# Patient Record
Sex: Male | Born: 1945
Health system: Southern US, Community
[De-identification: ages and names within clinical notes are randomized; demographics above are authoritative.]

## PROBLEM LIST (undated history)

## (undated) DIAGNOSIS — I639 Cerebral infarction, unspecified: Secondary | ICD-10-CM

## (undated) DIAGNOSIS — Q211 Atrial septal defect: Secondary | ICD-10-CM

## (undated) DIAGNOSIS — E786 Lipoprotein deficiency: Secondary | ICD-10-CM

## (undated) DIAGNOSIS — I2510 Atherosclerotic heart disease of native coronary artery without angina pectoris: Secondary | ICD-10-CM

## (undated) DIAGNOSIS — N400 Enlarged prostate without lower urinary tract symptoms: Secondary | ICD-10-CM

## (undated) DIAGNOSIS — Q2112 Patent foramen ovale: Secondary | ICD-10-CM

## (undated) DIAGNOSIS — I10 Essential (primary) hypertension: Secondary | ICD-10-CM

## (undated) HISTORY — DX: Cerebral infarction, unspecified: I63.9

## (undated) HISTORY — DX: Essential (primary) hypertension: I10

## (undated) HISTORY — DX: Lipoprotein deficiency: E78.6

## (undated) HISTORY — DX: Benign prostatic hyperplasia without lower urinary tract symptoms: N40.0

## (undated) HISTORY — PX: GALLBLADDER SURGERY: SHX652

## (undated) HISTORY — DX: Patent foramen ovale: Q21.12

## (undated) HISTORY — DX: Atherosclerotic heart disease of native coronary artery without angina pectoris: I25.10

## (undated) HISTORY — DX: Atrial septal defect: Q21.1

---

## 2001-10-15 ENCOUNTER — Ambulatory Visit (HOSPITAL_COMMUNITY): Admission: RE | Admit: 2001-10-15 | Discharge: 2001-10-15 | Payer: Self-pay | Admitting: General Surgery

## 2001-10-15 ENCOUNTER — Encounter: Payer: Self-pay | Admitting: General Surgery

## 2001-10-17 ENCOUNTER — Observation Stay (HOSPITAL_COMMUNITY): Admission: RE | Admit: 2001-10-17 | Discharge: 2001-10-18 | Payer: Self-pay | Admitting: General Surgery

## 2001-12-17 HISTORY — PX: COLONOSCOPY: SHX174

## 2002-12-14 ENCOUNTER — Ambulatory Visit (HOSPITAL_COMMUNITY): Admission: RE | Admit: 2002-12-14 | Discharge: 2002-12-14 | Payer: Self-pay | Admitting: Internal Medicine

## 2004-08-01 ENCOUNTER — Ambulatory Visit (HOSPITAL_COMMUNITY): Admission: RE | Admit: 2004-08-01 | Discharge: 2004-08-01 | Payer: Self-pay | Admitting: *Deleted

## 2004-08-03 ENCOUNTER — Inpatient Hospital Stay (HOSPITAL_BASED_OUTPATIENT_CLINIC_OR_DEPARTMENT_OTHER): Admission: RE | Admit: 2004-08-03 | Discharge: 2004-08-03 | Payer: Self-pay | Admitting: Cardiology

## 2004-12-17 HISTORY — PX: INGUINAL HERNIA REPAIR: SUR1180

## 2005-05-16 ENCOUNTER — Ambulatory Visit (HOSPITAL_COMMUNITY): Admission: RE | Admit: 2005-05-16 | Discharge: 2005-05-16 | Payer: Self-pay | Admitting: General Surgery

## 2005-05-16 ENCOUNTER — Emergency Department (HOSPITAL_COMMUNITY): Admission: EM | Admit: 2005-05-16 | Discharge: 2005-05-16 | Payer: Self-pay | Admitting: Emergency Medicine

## 2005-05-19 ENCOUNTER — Emergency Department (HOSPITAL_COMMUNITY): Admission: EM | Admit: 2005-05-19 | Discharge: 2005-05-19 | Payer: Self-pay | Admitting: Emergency Medicine

## 2005-11-21 ENCOUNTER — Ambulatory Visit: Payer: Self-pay | Admitting: Cardiovascular Disease

## 2005-12-17 DIAGNOSIS — I639 Cerebral infarction, unspecified: Secondary | ICD-10-CM

## 2005-12-17 HISTORY — DX: Cerebral infarction, unspecified: I63.9

## 2006-11-26 ENCOUNTER — Inpatient Hospital Stay (HOSPITAL_COMMUNITY): Admission: EM | Admit: 2006-11-26 | Discharge: 2006-11-29 | Payer: Self-pay | Admitting: Emergency Medicine

## 2006-11-26 ENCOUNTER — Ambulatory Visit: Payer: Self-pay | Admitting: Internal Medicine

## 2006-11-27 ENCOUNTER — Encounter: Payer: Self-pay | Admitting: Vascular Surgery

## 2006-11-28 ENCOUNTER — Encounter: Payer: Self-pay | Admitting: Cardiology

## 2006-12-02 ENCOUNTER — Ambulatory Visit: Payer: Self-pay | Admitting: Cardiology

## 2006-12-12 ENCOUNTER — Ambulatory Visit: Payer: Self-pay

## 2007-01-13 ENCOUNTER — Ambulatory Visit: Payer: Self-pay | Admitting: Cardiology

## 2007-01-13 LAB — CONVERTED CEMR LAB
ALT: 22 units/L (ref 0–40)
AST: 15 units/L (ref 0–37)
Albumin: 3.7 g/dL (ref 3.5–5.2)
HDL: 33.6 mg/dL — ABNORMAL LOW (ref >39.0)
Total Protein: 6.3 g/dL (ref 6.0–8.3)
Triglycerides: 69 mg/dL (ref 0–149)
VLDL: 14 mg/dL (ref 0–40)

## 2007-01-22 ENCOUNTER — Ambulatory Visit: Payer: Self-pay | Admitting: Cardiology

## 2007-04-30 ENCOUNTER — Ambulatory Visit: Payer: Self-pay | Admitting: Cardiology

## 2007-04-30 LAB — CONVERTED CEMR LAB
ALT: 27 units/L (ref 0–40)
Albumin: 4.2 g/dL (ref 3.5–5.2)
Alkaline Phosphatase: 57 units/L (ref 39–117)
Cholesterol: 146 mg/dL (ref 0–200)
HDL: 41.6 mg/dL (ref >39.0)
Total CHOL/HDL Ratio: 3.5
Triglycerides: 86 mg/dL (ref 0–149)

## 2007-05-21 ENCOUNTER — Ambulatory Visit (HOSPITAL_COMMUNITY): Admission: RE | Admit: 2007-05-21 | Discharge: 2007-05-21 | Payer: Self-pay | Admitting: Internal Medicine

## 2008-05-03 ENCOUNTER — Ambulatory Visit: Payer: Self-pay | Admitting: Cardiology

## 2008-05-03 LAB — CONVERTED CEMR LAB
Albumin: 3.7 g/dL (ref 3.5–5.2)
Alkaline Phosphatase: 49 units/L (ref 39–117)
BUN: 21 mg/dL (ref 6–23)
Chloride: 109 meq/L (ref 96–112)
Cholesterol: 124 mg/dL (ref 0–200)
Glucose, Bld: 111 mg/dL — ABNORMAL HIGH (ref 70–99)
Total Bilirubin: 1 mg/dL (ref 0.3–1.2)
Triglycerides: 126 mg/dL (ref 0–149)
VLDL: 25 mg/dL (ref 0–40)

## 2008-12-21 ENCOUNTER — Encounter: Payer: Self-pay | Admitting: Orthopedic Surgery

## 2008-12-30 ENCOUNTER — Ambulatory Visit: Payer: Self-pay | Admitting: Orthopedic Surgery

## 2008-12-30 DIAGNOSIS — M25819 Other specified joint disorders, unspecified shoulder: Secondary | ICD-10-CM | POA: Insufficient documentation

## 2008-12-30 DIAGNOSIS — M25519 Pain in unspecified shoulder: Secondary | ICD-10-CM | POA: Insufficient documentation

## 2008-12-30 DIAGNOSIS — M7580 Other shoulder lesions, unspecified shoulder: Secondary | ICD-10-CM

## 2009-02-14 ENCOUNTER — Ambulatory Visit: Payer: Self-pay | Admitting: Orthopedic Surgery

## 2009-02-15 ENCOUNTER — Telehealth: Payer: Self-pay | Admitting: Orthopedic Surgery

## 2009-02-15 ENCOUNTER — Encounter: Payer: Self-pay | Admitting: Orthopedic Surgery

## 2009-02-16 ENCOUNTER — Encounter: Payer: Self-pay | Admitting: Orthopedic Surgery

## 2009-02-18 ENCOUNTER — Ambulatory Visit (HOSPITAL_COMMUNITY): Admission: RE | Admit: 2009-02-18 | Discharge: 2009-02-18 | Payer: Self-pay | Admitting: Orthopedic Surgery

## 2009-02-24 ENCOUNTER — Ambulatory Visit: Payer: Self-pay | Admitting: Orthopedic Surgery

## 2009-04-21 ENCOUNTER — Ambulatory Visit: Payer: Self-pay | Admitting: Orthopedic Surgery

## 2009-06-16 ENCOUNTER — Encounter: Payer: Self-pay | Admitting: Cardiology

## 2009-06-16 ENCOUNTER — Ambulatory Visit: Payer: Self-pay | Admitting: Cardiology

## 2009-06-23 ENCOUNTER — Encounter: Payer: Self-pay | Admitting: Cardiology

## 2009-06-23 ENCOUNTER — Ambulatory Visit: Payer: Self-pay | Admitting: Cardiology

## 2009-06-23 ENCOUNTER — Ambulatory Visit (HOSPITAL_COMMUNITY): Admission: RE | Admit: 2009-06-23 | Discharge: 2009-06-23 | Payer: Self-pay | Admitting: Cardiology

## 2009-06-24 ENCOUNTER — Encounter: Payer: Self-pay | Admitting: Cardiology

## 2009-06-24 LAB — CONVERTED CEMR LAB
AST: 17 units/L (ref 0–37)
Albumin: 4.4 g/dL (ref 3.5–5.2)
Alkaline Phosphatase: 61 units/L (ref 39–117)
BUN: 26 mg/dL — ABNORMAL HIGH (ref 6–23)
CO2: 23 meq/L (ref 19–32)
Calcium: 9.5 mg/dL (ref 8.4–10.5)
Cholesterol: 162 mg/dL (ref 0–200)
Creatinine, Ser: 1.27 mg/dL (ref 0.40–1.50)
HDL: 47 mg/dL (ref >39)
LDL Cholesterol: 92 mg/dL (ref 0–99)
Total Bilirubin: 0.7 mg/dL (ref 0.3–1.2)
Total Protein: 6.7 g/dL (ref 6.0–8.3)
Triglycerides: 113 mg/dL (ref <150)

## 2009-12-13 ENCOUNTER — Ambulatory Visit: Payer: Self-pay | Admitting: Orthopedic Surgery

## 2009-12-13 DIAGNOSIS — M23302 Other meniscus derangements, unspecified lateral meniscus, unspecified knee: Secondary | ICD-10-CM | POA: Insufficient documentation

## 2009-12-13 DIAGNOSIS — M1710 Unilateral primary osteoarthritis, unspecified knee: Secondary | ICD-10-CM | POA: Insufficient documentation

## 2009-12-13 DIAGNOSIS — IMO0002 Reserved for concepts with insufficient information to code with codable children: Secondary | ICD-10-CM | POA: Insufficient documentation

## 2010-01-16 ENCOUNTER — Telehealth (INDEPENDENT_AMBULATORY_CARE_PROVIDER_SITE_OTHER): Payer: Self-pay

## 2010-07-04 ENCOUNTER — Ambulatory Visit: Payer: Self-pay | Admitting: Cardiology

## 2010-07-04 DIAGNOSIS — I10 Essential (primary) hypertension: Secondary | ICD-10-CM | POA: Insufficient documentation

## 2010-07-04 DIAGNOSIS — I2510 Atherosclerotic heart disease of native coronary artery without angina pectoris: Secondary | ICD-10-CM | POA: Insufficient documentation

## 2010-07-05 ENCOUNTER — Telehealth (INDEPENDENT_AMBULATORY_CARE_PROVIDER_SITE_OTHER): Payer: Self-pay

## 2010-08-31 ENCOUNTER — Ambulatory Visit: Payer: Self-pay | Admitting: Internal Medicine

## 2010-08-31 DIAGNOSIS — R1013 Epigastric pain: Secondary | ICD-10-CM | POA: Insufficient documentation

## 2010-08-31 DIAGNOSIS — K921 Melena: Secondary | ICD-10-CM | POA: Insufficient documentation

## 2010-09-01 ENCOUNTER — Encounter: Payer: Self-pay | Admitting: Internal Medicine

## 2010-09-16 HISTORY — PX: ESOPHAGOGASTRODUODENOSCOPY: SHX1529

## 2010-09-16 HISTORY — PX: COLONOSCOPY: SHX174

## 2010-09-18 ENCOUNTER — Ambulatory Visit: Payer: Self-pay | Admitting: Internal Medicine

## 2010-09-18 ENCOUNTER — Ambulatory Visit (HOSPITAL_COMMUNITY): Admission: RE | Admit: 2010-09-18 | Discharge: 2010-09-18 | Payer: Self-pay | Admitting: Internal Medicine

## 2010-09-22 ENCOUNTER — Encounter: Payer: Self-pay | Admitting: Internal Medicine

## 2011-01-14 LAB — CONVERTED CEMR LAB: Creatinine, Ser: 1.21 mg/dL (ref 0.40–1.50)

## 2011-01-16 NOTE — Letter (Signed)
Summary: REFERRAL FROM DR Margo Aye  REFERRAL FROM DR HALL   Imported By: Rexene Alberts 09/01/2010 09:43:43  _____________________________________________________________________  External Attachment:    Type:   Image     Comment:   External Document

## 2011-01-16 NOTE — Assessment & Plan Note (Signed)
Summary: gi bleeding,probable ulcer,dark stools/ss   Visit Type:  Initial Consult Referring Provider:  Dwana [name] Primary Care Provider:  Dwana [name]  Chief Complaint:  ? gi bleed/ ? ulcer/ dark stools.  History of Present Illness: [name] [name] is a pleasant 65 y/o WM, patient of Dr. Dwana [name], who presents for further evaluation of recent hypotension, epigastric pain, [name]. Symptoms began two weeks ago. Initally noted significant drop in energy. Several days after symptoms again, he saw Dr. Dwana [name], BP low. He had labs including CBC and H.Pylori but we have not received the records. He was started on Nexium.  Holding lisinopril and ASA. Tick bite prior to these symptoms. Now epig pain better, stools brown.     Current Medications (verified): 1)  Doxazosin Mesylate 8 Mg Tabs (Doxazosin Mesylate) .Marland Kitchen.. 1 Tab Once Daily 2)  Fish Oil 1000 Mg Caps (Omega-3 Fatty Acids) .Marland Kitchen.. 1 Tab Once Daily 3)  Finasteride 5 Mg Tabs (Finasteride) .Marland Kitchen.. 1 Tab Once Daily 4)  Lipitor 20 Mg Tabs (Atorvastatin Calcium) .... Take One Tablet By Mouth Daily. 5)  Nexium 40 Mg Cpdr (Esomeprazole Magnesium) .... One By Mouth Daily, Started 9/11  Allergies (verified): No Known Drug Allergies  Past History:  Past Medical History: htn cholesterol nonobstructive CAD BPH CVA, 2007 TCS, 2003, Dr. Rourk-->hemorrhoids, otherwise normal. Surveillance was due in 2008.  Past Surgical History: hernia, left inguinal, 2006 hernia gallbladder  Family History: NA No FH of CRC, liver, PUD. Brother, colostomy bag, GI issues since teenager. ?colon cancer age 68s Mother had colon polyps, advanced age, deceased due to AAA rupture.   Social History: Patient is married. Two children. retired from [employer]. Never smoked. Beer on weekend, 3-4 12 ounce  Review of Systems General:  Complains of fatigue and weakness; denies fever, chills, sweats, anorexia, and weight loss. Eyes:  Denies vision loss. ENT:  Denies nasal  congestion, sore throat, hoarseness, and difficulty swallowing. CV:  Denies chest pains, angina, palpitations, dyspnea on exertion, and peripheral edema. Resp:  Denies dyspnea at rest, dyspnea with exercise, cough, sputum, and wheezing. GI:  See HPI. GU:  Denies urinary burning and blood in urine. MS:  Denies joint pain / LOM. Derm:  Denies rash and itching. Neuro:  Denies weakness, frequent headaches, memory loss, and confusion. Psych:  Denies depression and anxiety. Endo:  Denies unusual weight change. Heme:  Denies bruising and bleeding. Allergy:  Denies hives and rash.  Vital Signs:  Patient profile:   65 year old male Height:      70 inches Weight:      194 pounds BMI:     27.94 Temp:     97.8 degrees F oral Pulse rate:   80 / minute BP sitting:   110 / 62  (left arm) Cuff size:   regular  Vitals Entered By: Cloria Spring LPN (August 31, 2010 1:23 PM)  Physical Exam  General:  Well developed, well nourished, no acute distress. Head:  Normocephalic and atraumatic. Eyes:  Conjunctivae pink, no scleral icterus.  Mouth:  Oropharyngeal mucosa moist, pink.  No lesions, erythema or exudate.    Neck:  Supple; no masses or thyromegaly. Lungs:  Clear throughout to auscultation. Heart:  Regular rate and rhythm; no murmurs, rubs,  or bruits. Abdomen:  Bowel sounds normal.  Abdomen is soft, nontender, nondistended.  No rebound or guarding.  No hepatosplenomegaly, masses or hernias.  No abdominal bruits.  Extremities:  No clubbing, cyanosis, edema or deformities noted. Neurologic:  Alert and  oriented x4;  grossly normal neurologically. Skin:  Intact without significant lesions or rashes. Cervical Nodes:  No significant cervical adenopathy. Psych:  Alert and cooperative. Normal mood and affect.  Impression & Recommendations:  Problem # 1:  EPIGASTRIC PAIN (ICD-789.06)  Recent fatigue, epigastric pain, [name]. Symtpoms better since starting Nexium. Tick exposure, likely  unrelated. ?intermittent GI bleed secondary to PUD. EGD to be performed in near future.  Risks, alternatives, benefits including but not limited to risk of reaction to medications, bleeding, infection, and perforation addressed.  Patient voiced understanding and verbal consent obtained.   We have requested labs from Dr. Keane Police office.  Orders: Consultation Level III ([address])  Problem # 2:  ? of NEOPLASM, MALIGNANT, COLON, FAMILY HX, SIBLING (ICD-V16.0)  Patient was recommended to have f/u TCS in 2008. Arrange for TCS now as patient is agreeable. Colonoscopy to be performed in near future.  Risks, alternatives, and benefits including but not limited to the risk of reaction to medication, bleeding, infection, and perforation were addressed.  Patient voiced understanding and provided verbal consent.   Orders: Consultation Level III ([address]) I would like to thank Dr. Dwana [name] for allowing [place] to take part in the care of this nice patient.

## 2011-01-16 NOTE — Progress Notes (Signed)
Summary: refill  Medications Added LISINOPRIL 20 MG TABS (LISINOPRIL) 1 tab once daily LIPITOR 20 MG TABS (ATORVASTATIN CALCIUM) Take one tablet by mouth daily.       Phone Note Call from Patient   Caller: Spouse Reason for Call: Refill Medication Summary of Call: Lisinopril 20mg  and Lipitor 10mg  (90 day supply)/wants refills for 1 yr sent to [facility]/tg Initial call taken by: Raechel Ache [facility],  January 16, 2010 4:31 PM    New/Updated Medications: LISINOPRIL 20 MG TABS (LISINOPRIL) 1 tab once daily LIPITOR 20 MG TABS (ATORVASTATIN CALCIUM) Take one tablet by mouth daily. Prescriptions: LIPITOR 20 MG TABS (ATORVASTATIN CALCIUM) Take one tablet by mouth daily.  #90 x 1   Entered by:   Larita Fife Via LPN   Authorized by:   Gaylord Shih, MD, [facility]   Signed by:   Larita Fife Via LPN on 16/09/9603   Method used:   Electronically to        [facility]* (mail-order)       [address] [place], [place]  [address]       Ph: [phone]       Fax: [phone]   RxID:   [id number] LISINOPRIL 20 MG TABS (LISINOPRIL) 1 tab once daily  #90 x 1   Entered by:   Larita Fife Via LPN   Authorized by:   Gaylord Shih, MD, [facility]   Signed by:   Larita Fife Via LPN on 10/13/2535   Method used:   Electronically to        [facility]* (mail-order)       [address] [place], [place]  [address]       Ph: [phone]       Fax: [phone]   RxID:   [id number]

## 2011-01-16 NOTE — Assessment & Plan Note (Signed)
Summary: 1 yr f/u per checkout on 06/16/09/tg      Allergies Added: NKDA  Primary Provider:  Catalina Pizza   History of Present Illness: Mr Bonito returns for E and M of his nonobstuctive CAD, HTN, HL, hx of hypertensive CVA, and asx atrial shunt.  He is extremely active and asx. No sxs of angina or TIAs. Lab work followed by Dr Margo Aye. Last Echo 7/10 stable.   Current Medications (verified): 1)  Aspirin 81 Mg Tbec (Aspirin) .... Take One Tablet By Mouth Daily 2)  Lisinopril 20 Mg Tabs (Lisinopril) .Marland Kitchen.. 1 Tab Once Daily 3)  Doxazosin Mesylate 8 Mg Tabs (Doxazosin Mesylate) .Marland Kitchen.. 1 Tab Once Daily 4)  Fish Oil 1000 Mg Caps (Omega-3 Fatty Acids) .Marland Kitchen.. 1 Tab Once Daily 5)  Finasteride 5 Mg Tabs (Finasteride) .Marland Kitchen.. 1 Tab Once Daily 6)  Lipitor 20 Mg Tabs (Atorvastatin Calcium) .... Take One Tablet By Mouth Daily.  Allergies (verified): No Known Drug Allergies  Past History:  Past Medical History: Last updated: 12/30/2008 htn cholesterol  Past Surgical History: Last updated: 12/30/2008 hernia gallbladder  Family History: Last updated: 12/30/2008 NA  Social History: Last updated: 12/30/2008 Patient is married.  retired  Risk Factors: Caffeine Use: 0 (12/30/2008)  Risk Factors: Smoking Status: never (12/30/2008)  Review of Systems       negative other than HPI  Vital Signs:  Patient profile:   65 year old male Height:      70 inches Weight:      191 pounds BMI:     27.50 Pulse rate:   67 / minute Resp:     16 per minute BP sitting:   93 / 53  (left arm)  Vitals Entered By: Marrion Coy, CNA (July 04, 2010 9:58 AM)  Physical Exam  General:  Well developed, well nourished, in no acute distress. Head:  normocephalic and atraumatic Eyes:  PERRLA/EOM intact; conjunctiva and lids normal. Neck:  Neck supple, no JVD. No masses, thyromegaly or abnormal cervical nodes. Chest Shandale Malak:  no deformities or breast masses noted Lungs:  Clear bilaterally to auscultation and  percussion. Heart:  Non-displaced PMI, chest non-tender; regular rate and rhythm, S1, S2 without murmurs, rubs or gallops. Carotid upstroke normal, no bruit. Normal abdominal aortic size, no bruits. Femorals normal pulses, no bruits. Pedals normal pulses. No edema, no varicosities. Abdomen:  Bowel sounds positive; abdomen soft and non-tender without masses, organomegaly, or hernias noted. No hepatosplenomegaly. Msk:  Back normal, normal gait. Muscle strength and tone normal. Pulses:  pulses normal in all 4 extremities Extremities:  No clubbing or cyanosis. Neurologic:  Alert and oriented x 3. Skin:  Intact without lesions or rashes. Psych:  Normal affect.   Problems:  Medical Problems Added: 1)  Dx of Hypertension, Benign  (ICD-401.1) 2)  Dx of Cad, Native Vessel  (ICD-414.01)  EKG  Procedure date:  07/04/2010  Findings:      NSR, Nl EKG  Impression & Recommendations:  Problem # 1:  CAD, NATIVE VESSEL (ICD-414.01) Assessment Unchanged  His updated medication list for this problem includes:    Aspirin 81 Mg Tbec (Aspirin) .Marland Kitchen... Take one tablet by mouth daily    Lisinopril 20 Mg Tabs (Lisinopril) .Marland Kitchen... 1 tab once daily  Problem # 2:  HYPERTENSION, BENIGN (ICD-401.1) Assessment: Improved  His updated medication list for this problem includes:    Aspirin 81 Mg Tbec (Aspirin) .Marland Kitchen... Take one tablet by mouth daily    Lisinopril 20 Mg Tabs (Lisinopril) .Marland Kitchen... 1 tab  once daily    Doxazosin Mesylate 8 Mg Tabs (Doxazosin mesylate) .Marland Kitchen... 1 tab once daily  Patient Instructions: 1)  Your physician recommends that you schedule a follow-up appointment in: 12 months 2)  Your physician recommends that you continue on your current medications as directed. Please refer to the Current Medication list given to you today.

## 2011-01-16 NOTE — Letter (Signed)
Summary: LABS FROM DR Dwana Melena  LABS FROM DR [facility]   Imported By: Rexene Alberts 09/22/2010 12:13:18  _____________________________________________________________________  External Attachment:    Type:   Image     Comment:   External Document

## 2011-01-16 NOTE — Letter (Signed)
Summary: tcs/egd order  tcs/egd order   Imported By: Ave Filter 08/31/2010 14:25:03  _____________________________________________________________________  External Attachment:    Type:   Image     Comment:   External Document

## 2011-01-16 NOTE — Letter (Signed)
Summary: Patient Notice, Endo Biopsy Results  [facility]  [address]   [place], [place] [address]   Phone: [phone]  Fax: [phone]       September 22, 2010   [name] [name] [address] [place], [place]  [address] May 28, 1946    Dear [name],  I am pleased to inform you that the biopsies taken during your recent endoscopic examination did not show any evidence of cancer or infection upon pathologic examination.  Additional information/recommendations:  Continue with the treatment plan as outlined on the day of your exam.  You should have a repeat endoscopic examination in 3 months.   Please call us if you are having persistent problems or have questions about your condition that have not been fully answered at this time.  Sincerely,    R. Roetta Sessions MD, FACP [facility] Ph: [phone]   Fax: [phone]   Appended Document: Patient Notice, Endo Biopsy Results letter mailed to pt  Appended Document: Patient Notice, Endo Biopsy Results reminder in computer

## 2011-01-16 NOTE — Progress Notes (Signed)
Summary: RX REFILLS   Phone Note Call from Patient Call back at [phone]   Caller: PT Reason for Call: Refill Medication Summary of Call: PT NEEDS RX CALLED IN SAME PLACE WE HAVE ALWAYS CALLED IN FOR THEM DOES NOT REMEMBER NAME. LIPITOR AND LISINOPRIL THEY ARE NO REFILLS SO THEY NEED NEW CALLED IN. Initial call taken by: Faythe Ghee,  July 05, 2010 11:48 AM    Prescriptions: LIPITOR 20 MG TABS (ATORVASTATIN CALCIUM) Take one tablet by mouth daily.  #90 x 3   Entered by:   Larita Fife Via LPN   Authorized by:   Gaylord Shih, MD, [facility]   Signed by:   Larita Fife Via LPN on 09/81/1914   Method used:   Electronically to        [facility]* (mail-order)       [address] [place], [place]  [address]       Ph: [phone]       Fax: [phone]   RxID:   [id number] LISINOPRIL 20 MG TABS (LISINOPRIL) 1 tab once daily  #90 x 3   Entered by:   Larita Fife Via LPN   Authorized by:   Gaylord Shih, MD, [facility]   Signed by:   Larita Fife Via LPN on 40/34/7425   Method used:   Electronically to        [facility]* (mail-order)       [address] [place], [place]  [address]       Ph: [phone]       Fax: [phone]   RxID:   [id number]

## 2011-01-25 ENCOUNTER — Encounter: Payer: Self-pay | Admitting: Internal Medicine

## 2011-01-31 ENCOUNTER — Encounter: Payer: Self-pay | Admitting: Internal Medicine

## 2011-02-01 NOTE — Letter (Addendum)
Summary: TRIAGE ORDER  TRIAGE ORDER   Imported By: Ave Filter 01/25/2011 09:56:53  _____________________________________________________________________  External Attachment:    Type:   Image     Comment:   External Document  Appended Document: TRIAGE ORDER PATIENT CALLED AN CANCELLED PROCEDURE/HE IS SICK N/V/D WILL CALL THE OFFICE BACK TO R/S  Appended Document: TRIAGE ORDER PATIENT IS R/S FOR FRIDAY MARCH 9TH AT 8:30

## 2011-02-23 ENCOUNTER — Encounter: Payer: Medicare Other | Admitting: Internal Medicine

## 2011-02-23 ENCOUNTER — Other Ambulatory Visit: Payer: Self-pay | Admitting: Internal Medicine

## 2011-02-23 ENCOUNTER — Ambulatory Visit (HOSPITAL_COMMUNITY)
Admission: RE | Admit: 2011-02-23 | Discharge: 2011-02-23 | Disposition: A | Discharge status: Home / Self Care | Payer: Medicare Other | Source: Ambulatory Visit | Attending: Internal Medicine | Admitting: Internal Medicine

## 2011-02-23 DIAGNOSIS — K257 Chronic gastric ulcer without hemorrhage or perforation: Secondary | ICD-10-CM | POA: Insufficient documentation

## 2011-02-23 DIAGNOSIS — K2100 Gastro-esophageal reflux disease with esophagitis, without bleeding: Secondary | ICD-10-CM

## 2011-02-23 DIAGNOSIS — I10 Essential (primary) hypertension: Secondary | ICD-10-CM | POA: Insufficient documentation

## 2011-02-23 DIAGNOSIS — E785 Hyperlipidemia, unspecified: Secondary | ICD-10-CM | POA: Insufficient documentation

## 2011-02-23 DIAGNOSIS — Z09 Encounter for follow-up examination after completed treatment for conditions other than malignant neoplasm: Secondary | ICD-10-CM

## 2011-02-23 DIAGNOSIS — K2940 Chronic atrophic gastritis without bleeding: Secondary | ICD-10-CM

## 2011-02-23 DIAGNOSIS — K449 Diaphragmatic hernia without obstruction or gangrene: Secondary | ICD-10-CM

## 2011-02-23 DIAGNOSIS — Z79899 Other long term (current) drug therapy: Secondary | ICD-10-CM | POA: Insufficient documentation

## 2011-02-26 HISTORY — PX: ESOPHAGOGASTRODUODENOSCOPY: SHX1529

## 2011-02-26 NOTE — Op Note (Signed)
  NAME:  [name] [name], [name] [name] NO.:  [id number]  MEDICAL RECORD NO.:  [id number]           PATIENT TYPE:  O  LOCATION:  DAYP                          FACILITY:  [facility]  PHYSICIAN:  R. Roetta Sessions, M.D. DATE OF BIRTH:  08/02/1946  DATE OF PROCEDURE: DATE OF DISCHARGE:                              OPERATIVE REPORT   PROCEDURE:  EGD with biopsy.  INDICATIONS FOR PROCEDURE:  A 65 year old gentleman with history of gastric ulcer diagnosed EGD.  Last fall, biopsies negative for malignancy and H. pylori.  He used to take quite a bit of aspirin powders, he is refrained from that behavior at my request.  Clinically, he is doing well, he is no longer acid suppression therapy.  EGD is now being done for followup.  Risks, benefits, limitations, alternatives, imponderables have been discussed, questions answered.  Please see the documentation in the medical record.  PROCEDURE NOTE:  O2 saturation, blood pressure, pulse, respirations were monitored throughout the entire procedure.  CONSCIOUS SEDATION:  Versed 4 mg IV, Demerol 75 mg IV in divided doses.  INSTRUMENT:  Pentax video chip system.  Cetacaine spray for topical pharyngeal anesthesia.  INSTRUMENT:  Pentax video chip system.  FINDINGS:  Examination of tubular esophagus revealed circumferential distal esophageal erosions within 5 mm of the EG junction, there is no Barrett's esophagus or other abnormality.  EG junction easily traversed.  Stomach:  Gastric cavity was emptied and insufflated well with air. Thorough examination of gastric mucosa including retroflexion of the proximal stomach, esophagogastric junction demonstrated hiatal hernia and multiple antral erosions.  There was no infiltrating process and there was no ulcer.  Pylorus was patent, easily traversed.  Examination of the bulb and second portion revealed no abnormalities.  THERAPEUTIC/DIAGNOSTIC MANEUVERS PERFORMED:  I elected to go ahead and biopsy the  inflamed antral mucosa one more time to double check for H. pylori.  He denies to take any nonsteroidal agents.  The patient tolerated the procedure well.  IMPRESSION: 1. Distal esophageal erosions consistent with mild reflux esophagitis. 2. Small hiatal hernia, antral erosions of uncertain significance     status post biopsy, otherwise normal stomach, patent pylorus,     normal D1 and D2.  RECOMMENDATIONS: 1. Would be prudent for, Mr. Ojeda, to remain on acid suppression     therapy and feel the benefits would outweigh the risk, i.e. Nexium     40 mg orally daily. 2. Antireflux literature provided. 3. Follow up on path. 4. Further recommendations to follow.     Jonathon Bellows, M.D.     RMR/MEDQ  D:  02/23/2011  T:  02/23/2011  Job:  [id number]  cc:   Catalina Pizza, M.D. Fax: [phone]  Electronically Signed by Lorrin Goodell M.D. on 02/26/2011 10:30:07 AM

## 2011-02-28 LAB — HEMOGLOBIN AND HEMATOCRIT, BLOOD: Hemoglobin: 15.1 g/dL (ref 13.0–17.0)

## 2011-03-04 ENCOUNTER — Encounter: Payer: Self-pay | Admitting: Internal Medicine

## 2011-03-15 NOTE — Letter (Signed)
Summary: Patient Notice, Endo Biopsy Results  [facility]  [address]   [place], [place] [address]   Phone: [phone]  Fax: [phone]       March 04, 2011   [name] [name] [address] [place], [place]  [address] [birth year]    Dear Mr. Yacoub,  I am pleased to inform you that the biopsies taken during your recent endoscopic examination did not show any evidence of cancer or infection upon pathologic examination.  There was only mild inflammation.  Additional information/recommendations:  No further action is needed at this time.  Please follow-up with your primary care physician for your other healthcare needs.  Continue with the treatment plan as outlined on the day of your exam.  Please call us if you are having persistent problems or have questions about your condition that have not been fully answered at this time.  Sincerely,    R. Roetta Sessions MD, Caleen Essex  [facility] Ph: [phone]   Fax: [phone]   Appended Document: Patient Notice, Endo Biopsy Results letter mailed to pt  Appended Document: Patient Notice, Endo Biopsy Results CC TO PCP  Appended Document: Patient Notice, Endo Biopsy Results reminder in epic

## 2011-05-01 NOTE — Assessment & Plan Note (Signed)
[facility]                            CARDIOLOGY OFFICE NOTE   NAME:Mayberry, KAMEL HAVEN                     MRN:          [id number]  DATE:04/30/2007                            DOB:          [birth year]    Mr. Cabello returns today for further management of the following issues.  1. Nonobstructive coronary artery disease.  2. Hypertension.  3. Status post left thalamic cerebrovascular event, probably      hypertensive.  4. Patent foramen ovale.  5. Atrial septal aneurysm.  6. Hyperlipidemia.   He has no complaints today.  He remains very active.   MEDICATIONS:  1. Aspirin 325 a day.  2. Lisinopril 20 mg a day.  3. Doxazosin 8 mg a day.  4. Lipitor 10 mg a day.   PHYSICAL EXAM:  Blood pressure is 128/66, pulse 67 and regular, weight  193.  HEENT:  Normocephalic, atraumatic.  PERRLA.  Extraocular muscles are  intact.  Sclerae clear.  Facial symmetry is normal.  NECK:  Supple.  Carotids are full without bruits.  There is no JVD.  Thyroid is not enlarged.  Trachea is midline.  LUNGS:  Clear.  HEART:  Soft S1, S2.  ABDOMEN:  Soft with good bowel sounds.  No midline bruit.  EXTREMITIES:  Reveal no edema.  Pulses intact.  NEURO:  Intact.   We need followup lipids and LFTs on Lipitor 10 mg a day.  Will obtain  these today.  I have made no changes in his program.  I will plan on  seeing him back in a year.     [name], MD, [facility]  Electronically Signed    TCW/MedQ  DD: 04/30/2007  DT: 04/30/2007  Job #: [id number]

## 2011-05-01 NOTE — Assessment & Plan Note (Signed)
[facility]                            CARDIOLOGY OFFICE NOTE   NAME:Dain, WINN MUEHL                     MRN:          [id number]  DATE:05/03/2008                            DOB:          04/05/1946    Mr. Sanford returns  for further management of the following issues:  1. Nonobstructive coronary disease.  2. Hypertension.  3. Status post hypertensive left thalamic cerebrovascular infarct.  4. Patent foramen ovale.  5. Atrial septal aneurysm with positive bubble study by TEE.  6. Hyperlipidemia.   He is doing remarkably well.  He remains extremely active.  He is due  blood work.   His medications include:  1. Aspirin 325 mg daily.  2. Lisinopril 20 mg daily.  3. Doxazosin 8 mg daily.  4 .  Lipitor 10 mg daily.   EXAMINATION:  Blood pressure 119/62.  His pulse is 66 and regular.  He  is in sinus rhythm with a normal EKG.  Low voltage.  Weight is 194,  stable.  HEENT:  Normocephalic, atraumatic.  PERRLA.  Extraocular movements  intact.  Sclerae clear.  Facial symmetry is normal.  Carotid upstrokes are equal bilaterally without bruits.  No JVD.  NECK:  Supple.  Thyroid is not enlarged.  LUNGS:  Clear.  Heart reveals a regular rate and rhythm.  No murmur, rub  or gallop.  ABDOMEN:  Soft.  Good bowel sounds. No midline bruit.  EXTREMITIES:  No clubbing, cyanosis or edema.  Pulses are intact.  NEURO:  Exam is intact.   Mr. Diemer is doing remarkably well.  We will continue with his current  medical program.  We will obtain a comprehensive metabolic panel and a  lipid panel.  We will call him with the results.  We will plan on seeing  him back in a year.  At that time, we will follow up with a 2D  echocardiogram.     Maisie Fus C. Daleen Squibb, MD, [facility]  Electronically Signed    TCW/MedQ  DD: 05/03/2008  DT: 05/03/2008  Job #: [id number]   cc:   Catalina Pizza, M.D.

## 2011-05-01 NOTE — Assessment & Plan Note (Signed)
[facility]                       [place] CARDIOLOGY OFFICE NOTE   NAME:[name] [name]                     MRN:          [id number]  DATE:06/16/2009                            DOB:          1945/12/26    [name] [name] returns today for followup of the following issues.  1. Nonobstructive coronary artery disease.  2. Hypertension.  3. Status post hypertensive, left thalamic cerebrovascular accident,      December 2007.  4. Patent foramen ovale.  5. Atrial septal aneurysm with positive bubble study by TE.  6. Mixed hyperlipidemia.   He is doing remarkably well.  He remains very [place] his [place].  He  has had no symptoms of angina or ischemia.  He denies any orthopnea, PND  peripheral edema, tachy palpitations, or syncope.  He has had no  symptoms of a mini stroke.   His meds include:  1. Aspirin 81 mg per day.  2. Fish oil 1000 mg per day.  3. Finasteride 5 mg per day.  4. Lipitor 10 mg per day.  5. Doxazosin 8 mg per day.  6. Lisinopril 20 mg per day.   He does have a dry cough, particularly at night.  I told him this was  lisinopril, but he would rather stay with this than go to a non generic  ARB.   PHYSICAL EXAMINATION:  VITAL SIGNS:  Today, his blood pressure is 103/53  in the right arm, 102/56 in the left arm, pulse 56 and regular.  His  weight is down 8 pounds to 186.  He is 5 feet 10 inches.  HEENT:  Looks much younger than stated age.  Alert and oriented x3.  NECK:  Supple.  Carotid upstrokes were equal bilaterally without bruits.  No thyromegaly.  Trachea is midline.  No lymphadenopathy.  CHEST:  Lungs  are clear to auscultation and percussion.  HEART:  Nondisplaced PMI.  Normal S1 and S2.  No gallop.  ABDOMEN:  Soft.  Good bowel sounds.  No midline bruit.  No hepatomegaly.  EXTREMITIES:  No cyanosis, clubbing, or edema.  Pulses are intact.  NEURO:  Intact.  SKIN:  Unremarkable.   ASSESSMENT AND PLAN:  [name] [name] is doing  remarkably well.  He has an  ACE induced cough, but he would rather stay with this than go with a non  generic ARB.  He is due fasting lipids and a CMET which we will arrange.  He has not had a 2-D echocardiogram to follow up on his patent foramen  ovale since 2007.  We will arrange such with a bubble study as an  outpatient.   Assuming, he is stable, I will see him back in a year.     [name], MD, [facility]  Electronically Signed    TCW/MedQ  DD: 06/16/2009  DT: 06/17/2009  Job #: [id number]   cc:   Catalina Pizza, M.D.

## 2011-05-04 NOTE — Op Note (Signed)
NAME:  CALVEN, GILKES NO.:  [id number]   MEDICAL RECORD NO.:  [id number]                   PATIENT TYPE:  AMB   LOCATION:  DAY                                  FACILITY:  [facility]   PHYSICIAN:  R. Roetta Sessions, M.D.              DATE OF BIRTH:  02/25/1946   DATE OF PROCEDURE:  DATE OF DISCHARGE:                                 OPERATIVE REPORT   PROCEDURE:  Screening colonoscopy.   ENDOSCOPIST:  Gerrit Friends. Rourk, M.D.   INDICATIONS FOR PROCEDURE:  The patient is a 65 year old gentleman with a  positive family history of colon cancer in a first degree relative and  precancerous polyps in another first degree relative.  He is devoid of any  lower GI tract symptoms.  He has never had his lower GI tract evaluated.  Colonoscopy is now being done.  This approach has been discussed with the  patient the potential risks, benefits, and alternatives have been reviewed.  Questions answered.  He is agreeable.  ASA 1.   DESCRIPTION OF PROCEDURE:  O2 saturation, blood pressure, pulse and  respirations were monitored throughout the entire procedure.   CONSCIOUS SEDATION:  Versed 5 mg IV, Demerol 100 mg IV in divided doses.   INSTRUMENT:  Olympus video chip and adult diagnostic colonoscope.   FINDINGS:  Digital rectal exam revealed no abnormalities.   ENDOSCOPIC FINDINGS:  The prep was good.   RECTUM:  Examination of the rectal mucosa including the retroflex view of  the anal verge revealed only internal hemorrhoids.   COLON:  The colonic mucosa was surveyed from the rectosigmoid junction  through the left transverse and right colon to the area of the appendiceal  orifice, ileocecal valve, and cecum.  These structures were well seen and  photographed for the record.  The colonic mucosa all the way to the cecum  appeared normal.  The terminal ileum was intubated to 5 cm and this segment  of the GI tract also appeared normal.   From this level the scope was  slowly withdrawn.  All previously mentioned  mucosal surfaces were again seen; and, again, no abnormalities were  observed.  The patient tolerated the procedure well and was reacted in  endoscopy.   IMPRESSION:  Internal hemorrhoids otherwise normal rectum, colon and  terminal ileum.    RECOMMENDATIONS:  Given the patient's family history he should have yearly  Hemoccults and should return for high-risk screening colonoscopy in 5 years.                                               Jonathon Bellows, M.D.    RMR/MEDQ  D:  12/14/2002  T:  12/14/2002  Job:  [id number]   cc:   Patrica Duel, M.D.  [phone]  [address]  [place]  [place] [address]  Fax: [phone]

## 2011-05-04 NOTE — Op Note (Signed)
[facility]  Patient:    [name] [name], [name] [name]. Visit Number: [id number] MRN: [id number]          Service Type: OBV Location: 3A A301 01 Attending Physician:  Barbaraann Barthel Dictated by:   Barbaraann Barthel, M.D. Proc. Date: 10/17/01 Admit Date:  10/17/2001 Discharge Date: 10/18/2001   CC:         Christin Bach, M.D.  Karleen Hampshire, M.D.   Operative Report  SURGEON:  Barbaraann Barthel, M.D.  ASSISTANT:  Christin Bach, M.D.  PREOPERATIVE DIAGNOSES:  Cholecystitis, cholelithiasis.  POSTOPERATIVE DIAGNOSES:  PROCEDURE:  Laparoscopic cholecystectomy.  ANESTHESIA:  General anesthesia.  NOTE:  This is a 65 year old white male who had occasional bouts of right upper quadrant pain accompanied with nausea.  This had been going on most prominently for the last three weeks.  Sonogram revealed multiple gallstones within the gallbladder and liver function studies and amylase were all within normal limits.  We had planned for laparoscopic cholecystectomy with him after discussing the procedure in detail including a discussion of risks not limited to but including bleeding, infection, damage to bile ducts, perforation of organs and transitory diarrhea.  Informed consent was obtained.  GROSS OPERATIVE FINDINGS:  The patients right upper quadrant had considerable fatty infiltration and the gallbladder was full of bilirubinate dark stones. The gallbladder was hourglass shaped and the cystic duct was of normal caliber and was not cannulated for cholangiogram.  Right upper quadrant otherwise appeared to be normal laparoscopically.  TECHNIQUE:  The patient was placed in the supine position and after the adequate administration of general anesthesia via endotracheal intubation, his entire abdomen was prepped with Betadine solution and draped in the usual manner.  Prior to this, a Foley catheter was aseptically inserted.  With the patient in Trendelenburg, a  periumbilical incision was carried out over the superior aspect of the umbilicus and the fascia was grasped with a sharp towel clip and a Veress needle was inserted and confirmed in position with a saline drop test.  The abdomen was then insufflated with about 3.7 L of CO2 and then using the Visiport technique, an 11-mm cannula was placed in the umbilicus with a camera in it.  Then under direct vision, three other cannulae were placed, an 11-mm cannula in the epigastrium and two 5-mm cannulae in the right upper quadrant laterally.  The gallbladder was grasped, its adhesions were taken down, the cystic duct was identified, triply silver-clipped on the side of the common bile duct and singly silver-clipped on the side of the gallbladder and divided.  Cystic artery was likely encountered, doubly silver-clipped and divided.  There was a small posterior vessel which was likewise silver-clipped and divided.  The gallbladder was then removed uneventfully from the hepatic bed using the hook cautery device.  There was a small amount of oozing from a little blood vessel which was controlled very easily with a clip device.  The wound was then irrigated and then after we checked for hemostasis, the gallbladder was removed using the EndoCatch sac device.  The gallbladder was then exited through the 11-mm incision in the epigastrium.  After checking for hemostasis and irrigating, I elected to leave a small piece of Surgicel in the liver bed and we then desufflated the abdomen and closed the fascia in the area of the epigastrium and the umbilicus with 0 Polysorb sutures, then closing all incisions with a surgical clip device. Prior to closure, all sponge, needle and instrument counts were found to be correct.  Estimated blood loss was minimal.  Patient received 1500 cc of crystalloids intraoperatively, no drains were placed and there were no complications. Dictated by:   Barbaraann Barthel, M.D. Attending  Physician:  Barbaraann Barthel DD:  10/17/01 TD:  10/19/01 Job: [id number]

## 2011-05-04 NOTE — Cardiovascular Report (Signed)
NAME:  [name] [name], [name] [name] NO.:  [id number]   MEDICAL RECORD NO.:  [id number]                   PATIENT TYPE:  OIB   LOCATION:  6501                                 FACILITY:  [facility]   PHYSICIAN:  Charlies Constable, M.D. [facility]              DATE OF BIRTH:  04/12/1946   DATE OF PROCEDURE:  08/03/2004  DATE OF DISCHARGE:  08/03/2004                              CARDIAC CATHETERIZATION   CLINICAL HISTORY:  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  He has had no prior history of known heart disease.  He  recently was found to have high blood pressure and had a stress Cardiolite,  which suggested possible inferior ischemia.  He was seen by Vida Roller,  M.D., who arranged for evaluation with angiography.   PROCEDURE:  The procedure was performed via the right femoral artery using  an arterial sheath and 4 French preformed coronary catheters.  A front wall  arterial puncture was performed and Omnipaque contrast was used.  A distal  aortogram was performed to rule out renovascular causes for hypertension.  The patient tolerated the procedure well and left the laboratory in  satisfactory condition.   RESULTS:  1. The aortic pressure was 124/71 with a mean of 94.  The left ventricular     pressure was 124/12.  2. Left main coronary artery:  The left main coronary artery was free of     significant disease.  3. Left anterior descending artery:  The left anterior descending artery     gave rise to a large and small diagonal branch and a large and small     septal perforator.  These and the LV proper were free of significant     disease.  4. Circumflex artery:  The circumflex artery gave rise to two posterolateral     branches.  There was also a small ramus branch and a small marginal     branch.  There was 50-60% narrowing in the proximal circumflex artery     before the small marginal branch.  5. Right coronary artery:  The right  coronary artery was a dominant vessel     that gave rise to a conus branch, two right ventricular branches, a     posterior descending branch, and two posterolateral branches.  These also     were free of significant disease.  6. Left ventriculogram:  The left ventriculogram performed in the RAO     projection showed good wall motion with no areas of hypokinesis.  The     estimated ejection fraction was 60%.  7. Distal aortogram:  A distal aortogram was performed, which showed patent     left renal artery and no significant aortoiliac obstruction.  The     catheter was situated a little too low to adequately visualize the right     renal artery, although I  think it probably was normal.   CONCLUSION:  Nonobstructive coronary artery disease with 50-60% stenosis in  the proximal circumflex artery, no significant obstruction in the left  anterior descending and right coronary arteries, and normal left ventricular  function.   RECOMMENDATIONS:  Reassurance.  Continue secondary risk factor modification.                                               Charlies Constable, M.D. [facility]    BB/MEDQ  D:  08/03/2004  T:  08/04/2004  Job:  [id number]   cc:   Patrica Duel, M.D.  [address]  [place]  [place] [address]  Fax: [phone]   Vida Roller, M.D.  Fax: [phone]   Cardiopulmonary Lab

## 2011-05-04 NOTE — Discharge Summary (Signed)
NAME:  [name] [name], [name] [name] NO.:  [id number]   MEDICAL RECORD NO.:  [id number]          PATIENT TYPE:  INP   LOCATION:  3714                         FACILITY:  [facility]   PHYSICIAN:  Madaline Guthrie, M.D.    DATE OF BIRTH:  08/07/1946   DATE OF ADMISSION:  11/26/2006  DATE OF DISCHARGE:  11/29/2006                               DISCHARGE SUMMARY   DISCHARGE DIAGNOSES:  1. Left thalamic cerebrovascular accident.  2. Hyperlipidemia.  3. Moderate to large patent foramen ovale and an atrioseptal aneurysm      on transesophageal echocardiogram.   DISCHARGE MEDICATIONS:  1. Aspirin 325 mg p.o. daily.  2. Zocor 20 mg p.o. nightly.   PHYSICIAN FOLLOWUP:  The patient is to follow up with his primary care  physician, Dr. Catalina Pizza, in [place].  The phone number has been  provided to the patient and he is instructed to call for followup.  As  well, he is to follow up with Dr. Jacinto Halim from cardiology in three weeks  regarding repair of his PFO.   IMAGING STUDIES PERFORMED DURING THIS HOSPITALIZATION:  On November 26, 2006, the patient had a CT head without contrast that showed no acute  intracranial abnormality.  On November 26, 2006, the patient had an  MRI/MRA of the brain that showed an acute infarct in the left lateral  thalamus.  On November 27, 2006, the patient had carotid dopplers that  showed an extremely tortuous left ICA from its origin, 40-60% left ICA  stenosis by velocity; however, plaque formation does not support the  increase and it is probably due to tortuosity.  There was no significant  right ICA stenosis noted and the vertebral artery flow was antegrade  bilaterally.  Patient also had a TEE that was performed on November 28, 2006 that was consistent with a left ventricular ejection fraction of  about 60%, diffuse mild aortic atherosclerosis, a heavily trabeculated  atrial appendage, Doppler of the atrial appendage demonstrates normal  emptying velocity.   The left atrial appendage function was normal.  There was a patent foramen ovale with a tunnel-like opening.  Bubble  study is positive, consistent with at least a moderate PFO.  There was  an atrioseptal aneurysm.  The pulmonary veins were grossly normal.   HISTORY AND PHYSICAL EXAMINATION:  For full details, please refer to the  patient's chart, but in brief, [name] [name] is a 65 year old Caucasian man  with no significant past medical history who presented to the [facility]  Emergency Department two days after he first noted some numbness and  tingling and a rubbery sensation of his right lower extremity.  He did  not seek medical attention at that time.  On the following day, he also  experienced some right upper extremity numbness and tingling.  At this  time he decided to visit his primary care physician, Dr. Margo Aye, who  recommended he come in to the emergency department.  His neurological  exam upon admission was completely intact and nonfocal.   VITAL SIGNS:  Vital signs upon admission showed a temperature of 97.7,  heart rate of 63, respirations 17, blood pressure 148/85 and an O2  saturation of 96% on room air.   LABS UPON ADMISSION:  WBC is 4.3, hemoglobin 15.6, hematocrit 45.3.  His  platelets are 197, MCV of 87.6.  He had a sodium of 140, potassium 3.5,  chloride 108, bicarbonate 22, BUN 21, creatinine 1.1 with a glucose of  83.  His total bilirubin was 0.9, alkaline phosphatase 55, AST 19, ALT  23, total protein 6, albumin 3.6, and a calcium of 9.  Urinalysis was  negative.  Hemoglobin A1c was 5.6.  A urine drug screen was completely  negative.  Patient also had a lipid profile on the morning of November 27, 2006 that showed a total cholesterol of 181, triglycerides of 125,  HDL cholesterol of 33 and an LDL cholesterol of 123.   HOSPITAL COURSE BY ACTIVE PROBLEM:  1. Acute left thalamic CVA.  Patient had absolutely no prior risk      factors.  He is not a diabetic.  He is  not hypertensive; however,      his LDL cholesterol was elevated.  Given the fact that he had this      stroke, we wanted to push his LDL cholesterol beneath 70, so we had      started him on a Statin, specifically Zocor 20 mg, to take one pill      at bedtime.  We decided to proceed with carotid Dopplers that      showed a 40-60% stenosis of the ICA; however, this was related to      tortuosity of the artery and not secondary to a plaque.  However,      his TEE was markedly abnormal showing a PFO and an atrioseptal      aneurysm.  These have been shown to consistently be a cause of      CVAs.  Cardiologists, Dr. Swaziland and Dr. Deborah Chalk have seen him as      well as Dr. Vickey Huger from neurology who has recommended just send      him home on the aspirin and the Zocor and no further      anticoagulation, meaning no Plavix and no Coumadin.  He will return      to Dr. Jacinto Halim in three weeks at which time they will discuss having      his PFO repaired.  2. Vital signs on the day of discharge:  Temperature 97, heart rate      67, respirations 20, blood pressure 135/77, 95% on room air.   LABS UPON DISCHARGE:  WBC is 4.4, hemoglobin 15.3, hematocrit 44.3,  platelets of 170, sodium 140, potassium 3.7, chloride 111, bicarb 22,  BUN 17, creatinine 1.2, glucose 88 and calcium of 89.      Peggye Pitt, M.D.  Electronically Signed      Madaline Guthrie, M.D.  Electronically Signed    EH/MEDQ  D:  11/30/2006  T:  12/01/2006  Job:  [id number]   cc:   Melvyn Novas, M.D.  Catalina Pizza, M.D.  Peter M. Swaziland, M.D.  Colleen Can. Deborah Chalk, M.D.

## 2011-05-04 NOTE — H&P (Signed)
NAME:  [name] [name], [name] [name] NO.:  [id number]   MEDICAL RECORD NO.:  [id number]                  PATIENT TYPE:   LOCATION:                                       FACILITY:  [facility]   PHYSICIAN:  R. Roetta Sessions, M.D.              DATE OF BIRTH:  1946-10-05   DATE OF ADMISSION:  11/05/2002  DATE OF DISCHARGE:                                HISTORY & PHYSICAL   CHIEF COMPLAINT:  Family history of colon cancer wants to have a  colonoscopy.   HISTORY OF PRESENT ILLNESS:  The patient is a pleasant 65 year old gentleman  who came to see me for a colonoscopy.  He reminded me that I have done  colonoscopies on his mother, [name] [name] and I have removed precancerous  polyps.  Unfortunately, his older brother was diagnosed with colorectal  cancer at age 35 and had a resection done at [facility] and he had a permanent  colostomy.  The patient has never had a lower GI tract hemorrhage, he is  devoid of any GI tract symptoms, no rectal bleeding, etc.  Back from  February 2002 through Dr. Geanie Logan office he had a normal CBC with H&H of  16.3 and 50.3, respectively.  He denies any GI symptoms such as odynophagia,  dysphagia, early satiety, reflux symptoms, nausea or vomiting, no change in  weight and again no abdominal pain or change in bowel function.   PAST MEDICAL HISTORY:  Unremarkable for chronic illnesses.   PAST SURGICAL HISTORY:  Cholecystectomy back in November 2002.   CURRENT MEDICATIONS:  None.   ALLERGIES:  No known drug allergies.   FAMILY HISTORY:  Mother is 90 years old and has history of colonic polyps  and has had multiple colonoscopies.  Father COPD.  Brother with colon  cancer.   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   REVIEW OF SYSTEMS:  As in history of present illness, no chest pain, dyspnea  on exertion, no fever or chills,  no change in weight.   PHYSICAL EXAMINATION:  GENERAL: A pleasant 65 year old gentleman resting  comfortably.  VITAL SIGNS: Weight 193, height 5 feet 10 inches, temperature 97.6, BP  138/70, pulse 70.  SKIN: Warm and dry.  No jaundice.  No cutaneous stigmata of chronic liver  disease.  HEENT: No scleral icterus.  JVD is not prominent.  CHEST: Lungs are clear to auscultation.  CARDIAC: Regular rate and rhythm without murmur, gallop, or rub.  ABDOMEN: Nondistended, positive bowel sounds, soft and nontender without  appreciable mass or organomegaly.  EXTREMITIES: No edema.  RECTAL: Deferred until time of colonoscopy.   IMPRESSION:  The patient is a 65 year old gentleman with positive family  history of colorectal neoplasia and precancerous colonic polyps who has  never had his lower gastrointestinal tract imaged.  He needs to have this  done in the near future.  I have offered the patient a colonoscopy.  The  potential risks, benefits, alternatives have been reviewed and questions  answered.  __________ for conscious sedation ASA1.  He is agreeable.   PLAN:  Perform this procedure at [facility] in the near future.  Further recommendations to follow.                                               Jonathon Bellows, M.D.    RMR/MEDQ  D:  11/05/2002  T:  11/05/2002  Job:  [id number]   cc:   Patrica Duel, M.D.  [address]  [place]  [place] [address]  Fax: [phone]

## 2011-05-04 NOTE — Assessment & Plan Note (Signed)
[facility]                            CARDIOLOGY OFFICE NOTE   NAME:Mcevoy, THELTON GRACA                     MRN:          [id number]  DATE:12/02/2006                            DOB:          11-19-1946    [name] [name] is a delightful 65 year old married white male from  [place], [place], whose son is a friend of mine, who comes  today for a second opinion concerning the need to repair or close  percutaneously an atrial septal defect.   He was in his usual state of fairly good health until last week.  At  that time, he developed some right lower extremity rubbery sensation and  numbness and tingling.  He was admitted to [facility].  He was  found to have a left thalamic cerebrovascular event on MRI.  CT scan was  negative.  He had a transesophageal echocardiogram, which showed a  moderate to large patent foramen ovale, and an atrial septal aneurysm.  This was performed by Dr. Peter Swaziland.  He had had a tunneling-like  opening.  He had a positive bubble study.   He was also seen by Dr. Vickey Huger of neurology.  She felt this was  unlikely an embolic event.  She recommended aspirin at this point in  time.   He was also found to be hypertensive in the hospital, but this was not  treated because of acute event.  In addition, he had mixed  hyperlipidemia with an LDL cholesterol 123, HDL of 33, total cholesterol  of 181.  He was started on simvastatin 20 mg a day.   Since discharge, he has done well.   His other blood work during his hospitalization was negative.  His  hemoglobin A1C was 5.6.  His potassium was 3.5.  Creatinine was 1.1.  LFTs were normal.  CBC was normal.   PAST MEDICAL HISTORY:  Significant for non-obstructive coronary artery  disease via heart catheterization with Dr. Charlies Constable June 03, 2004.  He had a 56% narrowing of the proximal circumflex vessel and an EF of  60%.  He had patent renal arteries on a  distal aortogram.   Past medical history is also significant for BPH on Cardura, for  frequency and urgency.   CURRENT MEDICATIONS:  Doxazosin 8 mg a day, aspirin 325 a day,  simvastatin 20 mg a day.   He has had a cholecystectomy and hernia repair in the past.   SOCIAL HISTORY:  He is married and has 2 children.  He works with [employer].  He does not smoke.  He does have 2 to 3 beers on the  weekend.   FAMILY HISTORY:  Significant for an aortic aneurysm in his mother, who  died at age 33.  His father died of emphysema.   REVIEW OF SYSTEMS:  He has had no frequent problems with headache,  visual changes, hearing loss.  Neck has been supple.  No masses or  soreness.  CARDIOVASCULAR:  Negative for orthopnea, PND, or peripheral  edema, or dyspnea on exertion.  PULMONARY:  No history of  pulmonary  emboli, hemoptysis, chronic cough.  GASTROINTESTINAL:  Negative for  dysphagia, nausea or vomiting, melena, or hematochezia.  GENITOURINARY:  Positive for frequency.  EXTREMITIES:  No problems with edema, pain, soreness, or swelling.  MUSCULOSKELETAL:  Unremarkable.  SKIN:  Unremarkable.  NEUROPSYCHIATRIC:  Unremarkable.   EXAM:  He is in no acute distress.  SKIN:  Warm and dry.  Intact.  Blood pressure is 146/76 in the left arm, pulse is 63 and regular.  EKG  confirms sinus rhythm.  He is 5 feet 10 inches and weighs 196 pounds.  PERRLA.  Extraocular muscles are intact.  Sclerae clear.  Facial  symmetry is normal.  Dentition is satisfactory.  Carotid upstrokes are equal bilaterally without bruits.  There is no  JVD.  Thyroid is not enlarged.  Trachea is midline.  LUNGS:  Clear to auscultation.  HEART:  Regular rate and rhythm.  There is no right ventricular lift.  S2 splits physiologically.  LUNGS:  Clear to auscultation and percussion.  ABDOMEN:  Soft with good bowel sounds.  No midline bruit.  There is no  hepatomegaly.  EXTREMITIES:  No cyanosis, clubbing, or edema.   Pulses are intact.  NEURO:  Intact.   ASSESSMENT AND PLAN:  1. Atrial septal defect, unrelated to his acute left thalamic infarct,      which was lacunar, and probably related to his hypertension.  2. Hypertension.  3. Nonobstructive coronary artery disease.  4. Mixed hyperlipidemia.   I had about a 30 minute conference with the patient and his wife in the  hospital, socially, and then today in the office.  I have made the  following recommendation.  1. Followup lipids and a comprehensive metabolic panel in 6 weeks.  2. Continue aspirin.  3. Continue doxazosin for his prostate.  4. Add lisinopril 20 mg a day.  5. Decrease salt intake.  6. Three hours of brisk walking and other cardio activity a week.  7. I will review the TEE with several of my partners.  It appears it      needs to be closed.  We will consult Dr. Bernette Redbird at [facility].   We may have to go up on the does of his simvastatin.  Goal LDL is 70.     [name], MD, [facility]  Electronically Signed    TCW/MedQ  DD: 12/02/2006  DT: 12/02/2006  Job #: [id number]   cc:   Catalina Pizza, M.D.  Madaline Guthrie, M.D.

## 2011-05-04 NOTE — Op Note (Signed)
NAME:  [name] [name], [name] [name] NO.:  [id number]   MEDICAL RECORD NO.:  [id number]          PATIENT TYPE:  AMB   LOCATION:  DAY                          FACILITY:  [facility]   PHYSICIAN:  Angelia Mould. Derrell Lolling, M.D.DATE OF BIRTH:  29-Sep-1946   DATE OF PROCEDURE:  05/16/2005  DATE OF DISCHARGE:                                 OPERATIVE REPORT   PREOPERATIVE DIAGNOSIS:  Recurrent left inguinal hernia.   POSTOPERATIVE DIAGNOSIS:  Recurrent left inguinal hernia.   OPERATION PERFORMED:  Laparoscopic preperitoneal repair of recurrent left  inguinal hernia with polypropylene mesh.   SURGEON:  Angelia Mould. Derrell Lolling, M.D.   OPERATIVE INDICATIONS:  This is a 65 year old white man who underwent repair  of both left and right inguinal hernia in the 1970s.  He does not think mesh  was used.  He has had a 3- to 4-year history of progressive bulge and some  discomfort on the left side.  On exam, he has a moderate-sized left inguinal  hernia which reduces.  Careful exam reveals no evidence of hernia on the  right.  He is brought to operating room electively for repair.   OPERATIVE TECHNIQUE:  Following the induction of general endotracheal  anesthesia, the patient's bladder was emptied with a Foley catheter and the  balloon was deflated.  The abdomen and genitalia were prepped and draped in  a sterile fashion.  Intravenous antibiotics were given.  The patient was  identified.  0.5% Marcaine with epinephrine was used as a local-infiltration  anesthetic.  A curved transverse incision was made at the lower rim of the  umbilicus.  The fascia was incised transversely, exposing the medial border  of the left rectus muscle.  A balloon dissector was carefully inserted in  the left rectus sheath behind the left rectus muscle, down to the symphysis  pubis in the midline.  Video camera was inserted.  The dissector balloon was  inflated with air under direct vision.  We had good deployment of the  balloon a  little bit more on the left than on the right, but good deployment  nevertheless.  We could see the muscles anteriorly, preperitoneal fat  posteriorly, inferior epigastric vessels laterally.  We held the balloon in  place for about 5 minutes and then deflated the balloon and removed it.  The  operating trocar was then inserted and the balloon inflated and secured,  then the preperitoneal space was insufflated to 12 mmHg.  There was a minor  amount of blood near the symphysis pubis which was evacuated.  Everything  else looked fine.  We placed a 5-mm trocar in the midline just below the  operating trocar and after taking down some adhesions, we placed a 5-mm  trocar in the right lower quadrant.   We dissected out the symphysis pubis and the pubic tubercle, and the  Cooper's ligament on the right.  We found that he had direct hernia.  We  debrided all of the fatty tissue out of the direct inguinal space and  dissected the pseudo-sac away from the fatty tissue and once this was all  dissected, the pseudo-sac retracted anteriorly.  There was some draping of  the inferior epigastric vessels and some extra vascular channels which had  to be controlled with metal clips and divided.  We took some adhesions down  on the left side laterally, mobilizing the peritoneum laterally and  superiorly a bit, and then we slowly dissected out the cord structures.  We  identified the vas deferens coming from medial to lateral and the testicular  vessels within the cord structures.  We found a lipoma of the cord which was  dissected all the way back as far as possible, proximal to the  anterosuperior iliac spine.  We skeletonized all the cord structures, but  really did not find an indirect sac at all.   The floor of the inguinal canal was repaired and reinforced with a Bard  brand 3-D mesh, large size.  This was inserted into the preperitoneal space  and positioned with the medial border medially and the  lateral border  laterally.  These had been marked with some Vicryl sutures prior to the  insertion.  The mesh was secured with 5-mm screw tacking device.  A screw  tack was placed on the superior border and the superior rim of the symphysis  pubis.  We then positioned the mesh superiorly, laterally and inferiorly so  as to overlap Cooper's ligament about 1 cm and then secured the mesh to the  superior rim of Cooper's ligament with 5 of the screw tacks.  The mesh was  secured up the midline and across the posterior belly of the rectus muscle.  Laterally, we made sure that we could palpate the tacker through the  abdominal wall and kept all the fixation above the iliopubic tract to avoid  injury to the sensory nerves.  After all this was done, the repair and  fixation of the mesh appeared secure.  We then pulled the lipoma of the  cord, which had been dissected away, up and tacked it to the mesh  superolaterally so would not reduce under the mesh.  The operative field was  irrigated a little bit.  It was actually quite dry; there is no bleeding.  The trocars were removed under direct vision and there was no bleeding from  the trocar sites.  Pneumoperitoneum was released.  The fascia at the  umbilicus was closed with 2 interrupted figure-of-eight sutures of 0 Vicryl.  The skin incision were closed with subcuticular sutures of 4-0 Monocryl and  Steri-Strips.  Clean bandages were placed and the patient taken to recovery  room in stable condition.  Estimated blood loss was about 15 mL.  Complications -- none.  Sponge and instrument counts were correct.      HMI/MEDQ  D:  05/16/2005  T:  05/16/2005  Job:  [id number]   cc:   Patrica Duel, M.D.  [address]  [place]  [place] [address]  Fax: [phone]

## 2011-05-04 NOTE — Assessment & Plan Note (Signed)
[name]                            CARDIOLOGY OFFICE NOTE   NAME:[name] [name], [name] [name]                     MRN:          [id number]  DATE:01/22/2007                            DOB:          March 10, 1946    [name] [name] comes in today for further management of the following  issues:  1. Hypertension.  2. Status post left thalamic cerebral vascular event, probably      hypertensive.  3. Patent foramen ovale.  4. Atrial septal aneurysm.  5. Hyperlipidemia.   He has eliminated salt from his diet. His blood pressure is down to  100/60 on just lisinopril 20 mg a day. He is on aspirin 325 a day and  doxazosin 8 mg a day.   He was in [place] on a mission trip and had aching all over. His wife  called me and I told him to stop his simvastatin. His aching resolved.   He has been on Lipitor in the past reviewing his chart without any side  effects. His lipids were actually at goal on Lipitor 5 mg a day.   Recent lipids shows a total cholesterol of 171, triglycerides of 69, HDL  of 33.6 and a LDL of 409. His LFTs were normal.   PHYSICAL EXAMINATION:  His blood pressure 100/61. His pulse is 63 and  regular. His EKG is normal. Weight is 194.  HEENT: Normocephalic, atraumatic. PERRLA. Extra-ocular movements intact.  Sclerae are clear. Facial symmetry is normal. Carotid are full without  bruits. There is no JVD.  Thyroid is not enlarged. Trachea is midline.  LUNGS:  Are clear.  HEART: Reveals a regular rate and rhythm without gallop, rub, or murmur.  ABDOMEN: Soft with good bowel sounds.  EXTREMITIES: Reveals no edema. Pulses are intact.  NEURO: Intact.   He tells me that Dr. Oliva Bustard office has been calling to do additional  studies. I will give her a call and see what this is concerning.   PLAN:  1. Begin Lipitor 10 mg a day with followup lipids and LFTs in 6 weeks.  2. Continue lisinopril.  3. Continue to avoid salt.  4. Talk to Dr. Vickey Huger.  5.  See me back again in three months.     [name], MD, [facility]  Electronically Signed    TCW/MedQ  DD: 01/22/2007  DT: 01/22/2007  Job #: [id number]   cc:   Catalina Pizza, M.D.

## 2011-05-04 NOTE — Consult Note (Signed)
NAME:  [name] [name], [name] [name] NO.:  [id number]   MEDICAL RECORD NO.:  [id number]          PATIENT TYPE:  INP   LOCATION:  3714                         FACILITY:  [facility]   PHYSICIAN:  Melvyn Novas, M.D.  DATE OF BIRTH:  [birth year]   DATE OF CONSULTATION:  11/29/2006  DATE OF DISCHARGE:                                 CONSULTATION   This 65 year old Caucasian right handed married male was admitted with  nonsustained symptoms of paresthesias and dysesthesias in his right leg.  He also felt slightly dizzy. Initially, it was believed that he could  have had a hypotensive episode, that he might have a viral infection.  He states for multiple times home with this symptoms before he presented  on December 11 and was admitted to the teaching service internal  medicine. The patient had, at that time, no very verifiable neurologic  or physical deficits, but his description of symptoms was reminiscent of  a possible TIA and a stroke workup was begun.  An MRI was obtained  showing a left thalamic stroke in the 1 cm range.  There was also a  possible internal carotid artery stenosis seen on the MRA and the  patient underwent a Doppler study which did not verify a high grade  stenosis. In the workup for further was seen at 40-60% ICA stenosis on  the left, no stenosis on the right.  The patient has been placed on baby  aspirin. He took at home Tylenol p.r.n.  No other medications. He is  here now on Protonix, Zocor, Lovenox and aspirin.   SOCIAL HISTORY:  The patient is married, he lives with his wife, the  couple has one adult healthy daughter who is here in the room.   A dictated H&P was not available but the patient's teaching service  notes state that he had suffered no further light headedness or  paresthesias in his legs since admission.  It was never verified that  any weakness truly existed.  He had presented to the primary care  physician first. An echocardiogram followed  looking for possible stroke  sources.  A PFO was discovered.  This patent foramen ovale is described  as tunnel like, and would be more difficult to close.  The bubble study  was positive to a moderate degree of shunt. There was also an atrial  septum aneurysm noticed. There was no clot confirmed.   PAST MEDICAL HISTORY:  The patient has had a left inguinal her hernia  repair and a cholecystectomy in 2002.  He denies any history of coronary  artery disease, memory deficits, febrile illness or trauma. No  gastrointestinal, genitourinary or current cardiopulmonary history.   PHYSICAL EXAMINATION:  VITAL SIGNS:  Stable, height 5 feet 10 inches,  weight is 87.7 kg, blood pressure today was 160/69, but the patient had  yesterday a spike in blood pressure 195/69, pulse rate is in the 60s and  regular, respiratory rate is 16.  LUNGS:  Clear to auscultation.  COR:  Regular rate and rhythm.  I cannot detect a murmur.  There is no  carotid bruit.  No temporal artery induration. No paraspinal tenderness.  EXTREMITIES:  No peripheral clubbing, cyanosis, edema, rash or bruising.  NEUROLOGICAL:  Mental status alert and oriented x3.  Cranial nerves:  Facial symmetry is preserved.  Full visual field to bilateral  simultaneous stimulation.  No tongue or uvula deviation.  No sensory  abnormalities.  No range of motion abnormalities. Motor examination  shows 5/5 strength bilaterally.  No pronator drift.  Gait is intact.  Stance is intact.  Romberg is negative.  Sensory is intact for all  primary modalities.  The patient denies diplopia, dysarthria, dysmetria,  ataxia or dysesthesia to be persistent.   ASSESSMENT:  Clinically, this stroke was of a character of a TIA  equivalent and has the size of a lacune which would correlate with the  documented hypertension. The dysesthesias in the right leg could very  well relate to the left thalamic lesion.  The only discovered risk  factor is the patent foramen  ovale.   PLAN:  A full size aspirin daily.  Continuation of blood pressure  control. Workup for hyperlipidemia control.  Continue Zocor. The patient  will follow-up with Dr. Jacinto Halim in three weeks for possible closure of the  PFO. He was also asked to start a multi vitamin with folic acid.      Melvyn Novas, M.D.  Electronically Signed     CD/MEDQ  D:  11/29/2006  T:  11/30/2006  Job:  [id number]   cc:   Cristy Hilts. Jacinto Halim, MD  Madaline Guthrie, M.D.

## 2011-06-11 ENCOUNTER — Encounter: Payer: Self-pay | Admitting: Cardiology

## 2011-06-11 ENCOUNTER — Ambulatory Visit (INDEPENDENT_AMBULATORY_CARE_PROVIDER_SITE_OTHER): Payer: Medicare Other | Admitting: Cardiology

## 2011-06-11 VITALS — BP 116/67 | HR 70 | Ht 70.0 in | Wt 190.0 lb

## 2011-06-11 DIAGNOSIS — I2510 Atherosclerotic heart disease of native coronary artery without angina pectoris: Secondary | ICD-10-CM

## 2011-06-11 DIAGNOSIS — I10 Essential (primary) hypertension: Secondary | ICD-10-CM

## 2011-06-11 MED ORDER — ATORVASTATIN CALCIUM 20 MG PO TABS: 20.0000 mg | ORAL_TABLET | Freq: Every day | ORAL | Status: DC

## 2011-06-11 NOTE — Progress Notes (Signed)
HPI [name] [name] returns today for evaluation and management of his coronary disease, hypertension, and patent foramen ovale.  He has no complaints of angina. He remains very active. His blood pressure was actually low and Dr. Margo Aye stopped his lisinopril. He is still on Cardura for prostate which also has an antihypertensive effect. He rarely gets lightheaded with standing.  His EKG today shows normal sinus rhythm, normal EKG Past Medical History  Diagnosis Date  . HTN (hypertension)   . Cholesterol depletion   . CAD (coronary artery disease)     nonobstructive  . CVA (cerebral vascular accident)   . BPH (benign prostatic hyperplasia)     Past Surgical History  Procedure Date  . Colonoscopy 2003    hemorrhoids,otherwise normal. Surveillance was due in 2008  . Inguinal hernia repair 2006    left  . Gallbladder surgery     No family history on file.  History   Social History  . Marital Status: Married    Spouse Name: N/A    Number of Children: N/A  . Years of Education: N/A   Occupational History  . Not on file.   Social History Main Topics  . Smoking status: Never Smoker   . Smokeless tobacco: Never Used  . Alcohol Use: No  . Drug Use: No  . Sexually Active: Not on file   Other Topics Concern  . Not on file   Social History Narrative  . No narrative on file    No Known Allergies  Current Outpatient Prescriptions  Medication Sig Dispense Refill  . atorvastatin (LIPITOR) 20 MG tablet Take 20 mg by mouth daily.       Marland Kitchen doxazosin (CARDURA) 8 MG tablet Take 8 mg by mouth at bedtime.        Marland Kitchen esomeprazole (NEXIUM) 40 MG capsule Take 40 mg by mouth daily before breakfast.        . finasteride (PROSCAR) 5 MG tablet Take 5 mg by mouth daily.        . fish oil-omega-3 fatty acids 1000 MG capsule Take 2 g by mouth daily.          ROS Negative other than HPI.   PE General Appearance: well developed, well nourished in no acute distress HEENT: symmetrical face,  PERRLA, good dentition  Neck: no JVD, thyromegaly, or adenopathy, trachea midline Chest: symmetric without deformity Cardiac: PMI non-displaced, RRR, normal S1, S2, no gallop or murmur Lung: clear to ausculation and percussion Vascular: all pulses full without bruits  Abdominal: nondistended, nontender, good bowel sounds, no HSM, no bruits Extremities: no cyanosis, clubbing or edema, no sign of DVT, no varicosities  Skin: normal color, no rashes Neuro: alert and oriented x 3, non-focal Pysch: normal affect Filed Vitals:   06/11/11 0954  BP: 116/67  Pulse: 70  Height: 5\' 10"  (1.778 m)  Weight: 190 lb (86.183 kg)  SpO2: 95%    EKG  Labs and Studies Reviewed.   Lab Results  Component Value Date   HGB 15.1 09/18/2010   HCT 43.8 09/18/2010      Chemistry      Component Value Date/Time   NA 142 06/23/2009 2331   K 4.2 06/23/2009 2331   CL 107 06/23/2009 2331   CO2 23 06/23/2009 2331   BUN 26* 06/23/2009 2331   CREATININE 1.27 06/23/2009 2331      Component Value Date/Time   CALCIUM 9.5 06/23/2009 2331   ALKPHOS 61 06/23/2009 2331   AST 17 06/23/2009 2331  ALT 28 06/23/2009 2331   BILITOT 0.7 06/23/2009 2331       Lab Results  Component Value Date   CHOL 162 06/23/2009   CHOL 124 05/03/2008   CHOL 146 04/30/2007   Lab Results  Component Value Date   HDL 47 06/23/2009   HDL 32.6* 05/03/2008   HDL 04.5 04/30/2007   Lab Results  Component Value Date   LDLCALC 92 06/23/2009   LDLCALC 66 05/03/2008   LDLCALC 87 04/30/2007   Lab Results  Component Value Date   TRIG 113 06/23/2009   TRIG 126 05/03/2008   TRIG 86 04/30/2007   Lab Results  Component Value Date   CHOLHDL 3.4 Ratio 06/23/2009   CHOLHDL 3.8 CALC 05/03/2008   CHOLHDL 3.5 CALC 04/30/2007   No results found for this basename: HGBA1C   Lab Results  Component Value Date   ALT 28 06/23/2009   AST 17 06/23/2009   ALKPHOS 61 06/23/2009   BILITOT 0.7 06/23/2009   No results found for this basename: TSH

## 2011-06-11 NOTE — Patient Instructions (Signed)
Your physician recommends that you continue on your current medications as directed. Please refer to the Current Medication list given to you today.  Your physician recommends that you schedule a follow-up appointment in: 1 year  

## 2011-06-11 NOTE — Assessment & Plan Note (Signed)
Stable. Continue medical therapy 

## 2011-06-11 NOTE — Assessment & Plan Note (Signed)
Improved. Continues to stay off of lisinopril.

## 2011-06-15 ENCOUNTER — Encounter: Payer: Self-pay | Admitting: Cardiology

## 2011-07-26 ENCOUNTER — Telehealth: Payer: Self-pay | Admitting: Cardiology

## 2011-07-26 MED ORDER — ATORVASTATIN CALCIUM 20 MG PO TABS: 20.0000 mg | ORAL_TABLET | Freq: Every day | ORAL | Status: DC

## 2011-07-26 NOTE — Telephone Encounter (Signed)
Patient needs refill on Atorvastatin 90 day supply sent to [facility] / tg

## 2012-03-05 ENCOUNTER — Other Ambulatory Visit: Payer: Self-pay

## 2012-03-06 MED ORDER — ESOMEPRAZOLE MAGNESIUM 40 MG PO CPDR: 40.0000 mg | DELAYED_RELEASE_CAPSULE | Freq: Every day | ORAL | Status: DC

## 2012-03-06 NOTE — Telephone Encounter (Signed)
Needs f/u OV in 2-3 months.

## 2012-03-06 NOTE — Telephone Encounter (Signed)
Made an appointment on June 18@ 8:00 with LSL

## 2012-05-21 ENCOUNTER — Encounter: Payer: Self-pay | Admitting: Cardiology

## 2012-05-21 ENCOUNTER — Ambulatory Visit (INDEPENDENT_AMBULATORY_CARE_PROVIDER_SITE_OTHER): Payer: Medicare Other | Admitting: Cardiology

## 2012-05-21 VITALS — BP 122/64 | HR 78 | Resp 16 | Ht 70.0 in | Wt 188.0 lb

## 2012-05-21 DIAGNOSIS — I2510 Atherosclerotic heart disease of native coronary artery without angina pectoris: Secondary | ICD-10-CM

## 2012-05-21 DIAGNOSIS — I10 Essential (primary) hypertension: Secondary | ICD-10-CM

## 2012-05-21 DIAGNOSIS — Q211 Atrial septal defect: Secondary | ICD-10-CM | POA: Insufficient documentation

## 2012-05-21 MED ORDER — CLOPIDOGREL BISULFATE 75 MG PO TABS: 75.0000 mg | ORAL_TABLET | Freq: Every day | ORAL | Status: DC

## 2012-05-21 NOTE — Assessment & Plan Note (Signed)
Stable

## 2012-05-21 NOTE — Progress Notes (Signed)
HPI [name] returns today for his history of coronary artery disease, patent foramen ovale with a history of a mild CVA, hypertension. He also has hyperlipidemia followed by Dr. Margo Aye.  He's totally asymptomatic with no chest pain or ischemic equivalence. He's had no symptoms of TIAs. Of note he is no longer on aspirin because of gastric ulceration.  His review of systems are negative.  Past Medical History  Diagnosis Date  . HTN (hypertension)   . Cholesterol depletion   . CAD (coronary artery disease)     nonobstructive  . CVA (cerebral vascular accident)   . BPH (benign prostatic hyperplasia)     Current Outpatient Prescriptions  Medication Sig Dispense Refill  . atorvastatin (LIPITOR) 20 MG tablet Take 1 tablet (20 mg total) by mouth daily.  90 tablet  3  . doxazosin (CARDURA) 8 MG tablet Take 8 mg by mouth at bedtime.        Marland Kitchen esomeprazole (NEXIUM) 40 MG capsule Take 1 capsule (40 mg total) by mouth daily before breakfast.  30 capsule  3  . finasteride (PROSCAR) 5 MG tablet Take 5 mg by mouth daily.        . fish oil-omega-3 fatty acids 1000 MG capsule Take 2 g by mouth daily.          No Known Allergies  No family history on file.  History   Social History  . Marital Status: Married    Spouse Name: N/A    Number of Children: N/A  . Years of Education: N/A   Occupational History  . Not on file.   Social History Main Topics  . Smoking status: Never Smoker   . Smokeless tobacco: Never Used  . Alcohol Use: No  . Drug Use: No  . Sexually Active: Not on file   Other Topics Concern  . Not on file   Social History Narrative  . No narrative on file    ROS ALL NEGATIVE EXCEPT THOSE NOTED IN HPI  PE  General Appearance: well developed, well nourished in no acute distress HEENT: symmetrical face, PERRLA, good dentition  Neck: no JVD, thyromegaly, or adenopathy, trachea midline Chest: symmetric without deformity Cardiac: PMI non-displaced, RRR, normal S1, S2,  no gallop or murmur Lung: clear to ausculation and percussion Vascular: all pulses full without bruits  Abdominal: nondistended, nontender, good bowel sounds, no HSM, no bruits Extremities: no cyanosis, clubbing or edema, no sign of DVT, no varicosities  Skin: normal color, no rashes Neuro: alert and oriented x 3, non-focal Pysch: normal affect  EKG Normal sinus rhythm, normal EKG BMET    Component Value Date/Time   NA 142 06/23/2009 2331   K 4.2 06/23/2009 2331   CL 107 06/23/2009 2331   CO2 23 06/23/2009 2331   GLUCOSE 106* 06/23/2009 2331   BUN 26* 06/23/2009 2331   CREATININE 1.27 06/23/2009 2331   CALCIUM 9.5 06/23/2009 2331   GFRNONAA 72 05/03/2008 1458   GFRAA 87 05/03/2008 1458    Lipid Panel     Component Value Date/Time   CHOL 162 06/23/2009 2331   TRIG 113 06/23/2009 2331   HDL 47 06/23/2009 2331   CHOLHDL 3.4 Ratio 06/23/2009 2331   VLDL 23 06/23/2009 2331   LDLCALC 92 06/23/2009 2331    CBC    Component Value Date/Time   HGB 15.1 09/18/2010 1222   HCT 43.8 09/18/2010 1222

## 2012-05-21 NOTE — Patient Instructions (Signed)
**Note De-Identified  Obfuscation** Your physician has recommended you make the following change in your medication: start taking Plavix 75 mg daily  Your physician recommends that you schedule a follow-up appointment in: 1 year

## 2012-05-21 NOTE — Assessment & Plan Note (Signed)
With his history of a stroke, and his inability take aspirin, I'll start him on Plavix 75 mg a day.

## 2012-05-21 NOTE — Assessment & Plan Note (Addendum)
Stable. Continue aggressive secondary preventative therapy. Return to the office in one year. Plavix will also be added.

## 2012-05-28 ENCOUNTER — Encounter: Payer: Self-pay | Admitting: Internal Medicine

## 2012-06-03 ENCOUNTER — Ambulatory Visit (INDEPENDENT_AMBULATORY_CARE_PROVIDER_SITE_OTHER): Payer: Medicare Other | Admitting: Gastroenterology

## 2012-06-03 ENCOUNTER — Encounter: Payer: Self-pay | Admitting: Gastroenterology

## 2012-06-03 VITALS — BP 113/63 | HR 68 | Temp 97.6°F | Ht 70.0 in | Wt 188.6 lb

## 2012-06-03 DIAGNOSIS — Z800 Family history of malignant neoplasm of digestive organs: Secondary | ICD-10-CM

## 2012-06-03 DIAGNOSIS — K219 Gastro-esophageal reflux disease without esophagitis: Secondary | ICD-10-CM

## 2012-06-03 DIAGNOSIS — K279 Peptic ulcer, site unspecified, unspecified as acute or chronic, without hemorrhage or perforation: Secondary | ICD-10-CM | POA: Insufficient documentation

## 2012-06-03 NOTE — Assessment & Plan Note (Signed)
Previously due to ASA. Clinically doing well. Continue PPI.

## 2012-06-03 NOTE — Progress Notes (Signed)
Primary Care Physician: Dwana Melena, MD  Primary Gastroenterologist:  Roetta Sessions, MD   Chief Complaint  Patient presents with  . Follow-up    HPI: [name] [name] is a 66 y.o. male here for f/u. Last seen at time of EGD in 02/2011. He was noted to have healed gastric ulcer but did have erosive reflux esophagitis. GU likely due to ASA at that time. Patient takes Nexium once daily. Denies heartburn, vomiting, dysphagia, abd pain, constipation, diarrhea, melena, rectal bleeding. Recently put on Plavix by Dr. Daleen Squibb to reduce risk of stroke in setting of patent foramen ovale.   Current Outpatient Prescriptions  Medication Sig Dispense Refill  . atorvastatin (LIPITOR) 20 MG tablet Take 1 tablet (20 mg total) by mouth daily.  90 tablet  3  . clopidogrel (PLAVIX) 75 MG tablet Take 1 tablet (75 mg total) by mouth daily.  90 tablet  3  . doxazosin (CARDURA) 8 MG tablet Take 8 mg by mouth at bedtime.        Marland Kitchen esomeprazole (NEXIUM) 40 MG capsule Take 1 capsule (40 mg total) by mouth daily before breakfast.  30 capsule  3  . finasteride (PROSCAR) 5 MG tablet Take 5 mg by mouth daily.        . fish oil-omega-3 fatty acids 1000 MG capsule Take 2 g by mouth daily.          Allergies as of 06/03/2012  . (No Known Allergies)    ROS:  General: Negative for anorexia, weight loss, fever, chills, fatigue, weakness. ENT: Negative for hoarseness, difficulty swallowing , nasal congestion. CV: Negative for chest pain, angina, palpitations, dyspnea on exertion, peripheral edema.  Respiratory: Negative for dyspnea at rest, dyspnea on exertion, cough, sputum, wheezing.  GI: See history of present illness. GU:  Negative for dysuria, hematuria, urinary incontinence, urinary frequency, nocturnal urination.  Endo: Negative for unusual weight change.    Physical Examination:   BP 113/63  Pulse 68  Temp 97.6 F (36.4 C) (Temporal)  Ht 5\' 10"  (1.778 m)  Wt 188 lb 9.6 oz (85.548 kg)  BMI 27.06  kg/m2  General: Well-nourished, well-developed in no acute distress.  Eyes: No icterus. Mouth: Oropharyngeal mucosa moist and pink , no lesions erythema or exudate. Lungs: Clear to auscultation bilaterally.  Heart: Regular rate and rhythm, no murmurs rubs or gallops.  Abdomen: Bowel sounds are normal, nontender, nondistended, no hepatosplenomegaly or masses, no abdominal bruits or hernia , no rebound or guarding.   Extremities: No lower extremity edema. No clubbing or deformities. Neuro: Alert and oriented x 4   Skin: Warm and dry, no jaundice.   Psych: Alert and cooperative, normal mood and affect.

## 2012-06-03 NOTE — Assessment & Plan Note (Signed)
Doing well on Nexium. EGD evidence of erosive reflux esophagitis off PPI in 02/2011. Benefits of medication outweigh the risk. Recommend continue Nexium daily. OV in two years.

## 2012-06-03 NOTE — Assessment & Plan Note (Signed)
Due colonoscopy in 09/2015.

## 2012-06-04 ENCOUNTER — Other Ambulatory Visit: Payer: Self-pay

## 2012-06-04 MED ORDER — ESOMEPRAZOLE MAGNESIUM 40 MG PO CPDR: 40.0000 mg | DELAYED_RELEASE_CAPSULE | Freq: Every day | ORAL | Status: DC

## 2012-06-05 NOTE — Progress Notes (Signed)
Faxed to PCP

## 2012-09-09 ENCOUNTER — Other Ambulatory Visit: Payer: Self-pay | Admitting: Cardiology

## 2012-09-09 MED ORDER — ATORVASTATIN CALCIUM 20 MG PO TABS: 20.0000 mg | ORAL_TABLET | Freq: Every day | ORAL | Status: DC

## 2012-12-26 ENCOUNTER — Telehealth: Payer: Self-pay | Admitting: Cardiology

## 2012-12-26 MED ORDER — ATORVASTATIN CALCIUM 20 MG PO TABS: 20.0000 mg | ORAL_TABLET | Freq: Every day | ORAL | Status: DC

## 2012-12-26 NOTE — Telephone Encounter (Signed)
PT NEEDS LIPITOR 40 MG CALLED IN TO [facility] IN [place]

## 2012-12-26 NOTE — Telephone Encounter (Signed)
Called pt to verify any changes to Lipitor per noted pt PCP notations and MD Wall did not advised changes to 40mg  and last notes states pt is on 20mg , pt wife advised she said the wrong dosage and verified pt is on 20mg  of lipitor, sent via escribe

## 2013-02-24 ENCOUNTER — Telehealth: Payer: Self-pay | Admitting: Cardiology

## 2013-02-24 MED ORDER — CLOPIDOGREL BISULFATE 75 MG PO TABS: 75.0000 mg | ORAL_TABLET | Freq: Every day | ORAL | Status: DC

## 2013-02-24 MED ORDER — ATORVASTATIN CALCIUM 20 MG PO TABS: 20.0000 mg | ORAL_TABLET | Freq: Every day | ORAL | Status: DC

## 2013-02-24 NOTE — Telephone Encounter (Signed)
FYI: Spoke to pt wife to advise results/instructions. Pt wife understood. Advised pt does NEED to take both Plavix and Lipitor, sent refill to [facility] in [place] via escribe, advised she will more than likely have to pay for Lipitor out of pocket however she can always call the insurance company to advise, also to contact PCP to see if they care in office samples to give pt until refill due, pt wife understood all instructions, will pick up both medications, reiterated importance of pt taking Plavix per notes pt stopped taking a month ago, noted no sxs of concern at this time, also advised to not hesitate to call office with any questions concerning medications and refills

## 2013-02-24 NOTE — Telephone Encounter (Signed)
Patient wife states that the refill of atorvastatin (LIPITOR) 20 MG tablet was called in to the [facility], she is not sure what happened to the prescription but he has not had it for 2-3 days approx.  Does patient need?  What can be done regarding missing medication (can another refill be called in?).  She thinks it was a 90 day supply, and may have thrown 60 days away.   Patient wife said patient is out of clopidogrel (PLAVIX) 75 MG tablet but is unsure if this has been D/C.  Please call patient [phone] hm or cell [phone].  If this is needed, they will need a 90 day supply.

## 2013-05-19 ENCOUNTER — Ambulatory Visit: Payer: Medicare Other | Admitting: Cardiology

## 2013-06-12 ENCOUNTER — Ambulatory Visit: Payer: Medicare Other | Admitting: Cardiology

## 2013-07-09 ENCOUNTER — Ambulatory Visit (INDEPENDENT_AMBULATORY_CARE_PROVIDER_SITE_OTHER): Payer: Medicare Other | Admitting: Cardiology

## 2013-07-09 ENCOUNTER — Encounter: Payer: Self-pay | Admitting: Cardiology

## 2013-07-09 VITALS — BP 130/66 | HR 69 | Ht 70.0 in | Wt 192.8 lb

## 2013-07-09 DIAGNOSIS — Z8673 Personal history of transient ischemic attack (TIA), and cerebral infarction without residual deficits: Secondary | ICD-10-CM

## 2013-07-09 DIAGNOSIS — I2510 Atherosclerotic heart disease of native coronary artery without angina pectoris: Secondary | ICD-10-CM

## 2013-07-09 DIAGNOSIS — E785 Hyperlipidemia, unspecified: Secondary | ICD-10-CM | POA: Insufficient documentation

## 2013-07-09 DIAGNOSIS — I10 Essential (primary) hypertension: Secondary | ICD-10-CM

## 2013-07-09 DIAGNOSIS — Q211 Atrial septal defect: Secondary | ICD-10-CM

## 2013-07-09 MED ORDER — ATORVASTATIN CALCIUM 20 MG PO TABS: 20.0000 mg | ORAL_TABLET | Freq: Every day | ORAL | Status: DC

## 2013-07-09 MED ORDER — CLOPIDOGREL BISULFATE 75 MG PO TABS: 75.0000 mg | ORAL_TABLET | Freq: Every day | ORAL | Status: AC

## 2013-07-09 NOTE — Assessment & Plan Note (Signed)
Stable. Continue secondary preventative therapy. Return the office in one year. 

## 2013-07-09 NOTE — Assessment & Plan Note (Signed)
Continue statin. Blood work with primary care.

## 2013-07-09 NOTE — Assessment & Plan Note (Signed)
Stable by history and exam. Continue Plavix. Patient is aware of symptoms of TIAs and stroke and knows how to respond appropriately. He'll return the office in one year to see Dr.Konoswaren.

## 2013-07-09 NOTE — Progress Notes (Signed)
HPI [name] returns today for evaluation and management of his history of nonobstructive coronary artery disease, hypertension, hyperlipidemia, history of embolic CVA, history of patent foramen ovale. He is intolerant of aspirin remains on Plavix.  He's had no symptoms of TIAs or mini strokes. He seems to be very compliant with his medications. Lab data is followed by Dr. Margo Aye. He denies any chest discomfort or ischemic heart disease symptoms. 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  Past Medical History  Diagnosis Date  . HTN (hypertension)   . Cholesterol depletion   . CAD (coronary artery disease)     nonobstructive  . CVA (cerebral vascular accident)   . BPH (benign prostatic hyperplasia)   . Patent foramen ovale     on plavix, followed by Dr. Valera Castle    Current Outpatient Prescriptions  Medication Sig Dispense Refill  . aspirin 81 MG tablet Take 81 mg by mouth daily.      Marland Kitchen atorvastatin (LIPITOR) 20 MG tablet Take 1 tablet (20 mg total) by mouth daily.  90 tablet  3  . clopidogrel (PLAVIX) 75 MG tablet Take 1 tablet (75 mg total) by mouth daily.  90 tablet  3  . doxazosin (CARDURA) 8 MG tablet Take 8 mg by mouth at bedtime.        Marland Kitchen esomeprazole (NEXIUM) 40 MG capsule Take 40 mg by mouth every other day.      . finasteride (PROSCAR) 5 MG tablet Take 5 mg by mouth daily.        . fish oil-omega-3 fatty acids 1000 MG capsule Take 2 g by mouth daily.         No current facility-administered medications for this visit.    No Known Allergies  Family History  Problem Relation Age of Onset  . Colon cancer Brother     ???, has colostomy  . Colon polyps Mother     advanced age, died secondary to AAA rupture    History   Social History  . Marital Status: Married    Spouse Name: N/A    Number of Children: N/A  . Years of Education: N/A   Occupational History  . retired from [employer]    Social History Main Topics  . Smoking status: Never Smoker    . Smokeless tobacco: Never Used  . Alcohol Use: No  . Drug Use: No  . Sexually Active: Not on file   Other Topics Concern  . Not on file   Social History Narrative  . No narrative on file    ROS ALL NEGATIVE EXCEPT THOSE NOTED IN HPI  PE  General Appearance: well developed, well nourished in no acute distress HEENT: symmetrical face, PERRLA, good dentition  Neck: no JVD, thyromegaly, or adenopathy, trachea midline Chest: symmetric without deformity Cardiac: PMI non-displaced, RRR, normal S1, S2, no gallop or murmur Lung: clear to ausculation and percussion Vascular: all pulses full without bruits  Abdominal: nondistended, nontender, good bowel sounds, no HSM, no bruits Extremities: no cyanosis, clubbing or edema, no sign of DVT, no varicosities  Skin: normal color, no rashes Neuro: alert and oriented x 3, non-focal Pysch: normal affect  EKG Normal sinus rhythm, poor R. progression, no change BMET    Component Value Date/Time   NA 142 06/23/2009 2331   K 4.2 06/23/2009 2331   CL 107 06/23/2009 2331   CO2 23 06/23/2009 2331   GLUCOSE 106* 06/23/2009 2331   BUN 26* 06/23/2009 2331  CREATININE 1.27 06/23/2009 2331   CALCIUM 9.5 06/23/2009 2331   GFRNONAA 72 05/03/2008 1458   GFRAA 87 05/03/2008 1458    Lipid Panel     Component Value Date/Time   CHOL 162 06/23/2009 2331   TRIG 113 06/23/2009 2331   HDL 47 06/23/2009 2331   CHOLHDL 3.4 Ratio 06/23/2009 2331   VLDL 23 06/23/2009 2331   LDLCALC 92 06/23/2009 2331    CBC    Component Value Date/Time   HGB 15.1 09/18/2010 1222   HCT 43.8 09/18/2010 1222

## 2013-07-09 NOTE — Patient Instructions (Addendum)
Your physician recommends that you schedule a follow-up appointment in: ONE YEAR WITH Dr. Purvis Sheffield

## 2013-11-17 ENCOUNTER — Ambulatory Visit: Payer: Medicare Other | Admitting: Urology

## 2013-12-22 ENCOUNTER — Ambulatory Visit (INDEPENDENT_AMBULATORY_CARE_PROVIDER_SITE_OTHER): Payer: Medicare Other | Admitting: Urology

## 2013-12-22 DIAGNOSIS — R972 Elevated prostate specific antigen [PSA]: Secondary | ICD-10-CM

## 2013-12-22 DIAGNOSIS — N400 Enlarged prostate without lower urinary tract symptoms: Secondary | ICD-10-CM

## 2014-06-09 ENCOUNTER — Encounter: Payer: Self-pay | Admitting: Internal Medicine

## 2014-07-21 ENCOUNTER — Ambulatory Visit (HOSPITAL_COMMUNITY)
Admission: RE | Admit: 2014-07-21 | Discharge: 2014-07-21 | Disposition: A | Discharge status: Home / Self Care | Payer: Medicare Other | Source: Ambulatory Visit | Attending: Internal Medicine | Admitting: Internal Medicine

## 2014-07-21 ENCOUNTER — Other Ambulatory Visit (HOSPITAL_COMMUNITY): Payer: Self-pay | Admitting: Internal Medicine

## 2014-07-21 DIAGNOSIS — R29898 Other symptoms and signs involving the musculoskeletal system: Secondary | ICD-10-CM | POA: Diagnosis not present

## 2014-07-21 DIAGNOSIS — I809 Phlebitis and thrombophlebitis of unspecified site: Secondary | ICD-10-CM | POA: Insufficient documentation

## 2014-09-02 ENCOUNTER — Telehealth: Payer: Self-pay | Admitting: Cardiology

## 2014-09-02 NOTE — Telephone Encounter (Signed)
Received fax refill request  Rx # [id number] Medication:  Clopidogrel 75 mg tab Qty 90 Sig:  Take one tablet by mouth once daily Physician:  Verl Blalock Received fax refill request  Rx # [id number] Medication:  Lipitor 20 mg tab Qty 90 Sig:  Take one tablet by mouth once daily Physician:  Verl Blalock

## 2014-09-02 NOTE — Telephone Encounter (Signed)
Pt needs appt for further refills. 

## 2014-09-07 ENCOUNTER — Telehealth: Payer: Self-pay | Admitting: Cardiology

## 2014-09-07 NOTE — Telephone Encounter (Signed)
Pt needs appt for further refills. 

## 2014-09-07 NOTE — Telephone Encounter (Signed)
Received fax refill request  Rx # [id number] Medication:  Lipitor 20 mg tab Qty 90 Sig:  Take one tablet by mouth once daily Physician:  Verl Blalock Received fax refill request  Rx # [id number]  Medication:  Clopidogrel 75 mg tab Qty 90 Sig:  Take one tablet by mouth once daily Physician:  Verl Blalock

## 2014-10-08 ENCOUNTER — Ambulatory Visit (INDEPENDENT_AMBULATORY_CARE_PROVIDER_SITE_OTHER): Payer: Medicare Other | Admitting: Cardiovascular Disease

## 2014-10-08 ENCOUNTER — Encounter: Payer: Self-pay | Admitting: Cardiovascular Disease

## 2014-10-08 VITALS — BP 114/68 | HR 63 | Ht 70.0 in | Wt 194.1 lb

## 2014-10-08 DIAGNOSIS — I10 Essential (primary) hypertension: Secondary | ICD-10-CM

## 2014-10-08 DIAGNOSIS — I2510 Atherosclerotic heart disease of native coronary artery without angina pectoris: Secondary | ICD-10-CM

## 2014-10-08 DIAGNOSIS — Q211 Atrial septal defect: Secondary | ICD-10-CM

## 2014-10-08 DIAGNOSIS — E785 Hyperlipidemia, unspecified: Secondary | ICD-10-CM

## 2014-10-08 DIAGNOSIS — Q2112 Patent foramen ovale: Secondary | ICD-10-CM

## 2014-10-08 DIAGNOSIS — Z8673 Personal history of transient ischemic attack (TIA), and cerebral infarction without residual deficits: Secondary | ICD-10-CM

## 2014-10-08 MED ORDER — ATORVASTATIN CALCIUM 20 MG PO TABS: 20.0000 mg | ORAL_TABLET | Freq: Every day | ORAL | Status: DC

## 2014-10-08 MED ORDER — CLOPIDOGREL BISULFATE 75 MG PO TABS: 75.0000 mg | ORAL_TABLET | Freq: Every day | ORAL | Status: DC

## 2014-10-08 NOTE — Patient Instructions (Signed)

## 2014-10-08 NOTE — Progress Notes (Signed)
Patient ID: [name] [name], male   DOB: 1946/11/08, 68 y.o.   MRN: [id number]      SUBJECTIVE: [name] [name] presents for follow up of nonobstructive coronary artery disease, hypertension, hyperlipidemia, history of embolic CVA, and history of patent foramen ovale. He is intolerant of aspirin remains on Plavix. Coronary angiography on 08/03/2004 demonstrated a 50-60% stenosis in the proximal left circumflex coronary artery. The patient denies any symptoms of chest pain, palpitations, shortness of breath, lightheadedness, dizziness, leg swelling, orthopnea, PND, and syncope. 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 His mother lived to the age of 75. ECG performed in the office today demonstrates normal sinus rhythm with no ischemic ST segment or T wave abnormalities noted. Heart rate 62 beats per minute.  Review of Systems: As per "subjective", otherwise negative.  Allergies  Allergen Reactions  . Asa [Aspirin] Other (See Comments)    Caused ulcer in stomach / GI bleeding     Current Outpatient Prescriptions  Medication Sig Dispense Refill  . atorvastatin (LIPITOR) 20 MG tablet Take 1 tablet (20 mg total) by mouth daily.  90 tablet  3  . clopidogrel (PLAVIX) 75 MG tablet Take 75 mg by mouth daily.      Marland Kitchen doxazosin (CARDURA) 8 MG tablet Take 8 mg by mouth at bedtime.        . finasteride (PROSCAR) 5 MG tablet Take 5 mg by mouth daily.        . fish oil-omega-3 fatty acids 1000 MG capsule Take 2 g by mouth daily.         No current facility-administered medications for this visit.    Past Medical History  Diagnosis Date  . HTN (hypertension)   . Cholesterol depletion   . CAD (coronary artery disease)     nonobstructive  . CVA (cerebral vascular accident)   . BPH (benign prostatic hyperplasia)   . Patent foramen ovale     on plavix, followed by Dr. Jenell Milliner    Past Surgical History  Procedure Laterality Date  . Colonoscopy  2003    hemorrhoids,otherwise normal.   . Inguinal hernia  repair  2006    left  . Gallbladder surgery    . Esophagogastroduodenoscopy  02/26/11    esophageal erosions consistent with mild relfux/small HH, antral erosions bx mild chronic gastritis and NO h.pylori  . Esophagogastroduodenoscopy  09/2010    schatzki ring not manipulated. gastric ulcer with satellite erosions  . Colonoscopy  09/2010    internal hemorrhoids, next TCS in 09/2015    History   Social History  . Marital Status: Married    Spouse Name: N/A    Number of Children: N/A  . Years of Education: N/A   Occupational History  . retired from [employer] Topics  . Smoking status: Never Smoker   . Smokeless tobacco: Never Used  . Alcohol Use: No  . Drug Use: No  . Sexual Activity: Not on file   Other Topics Concern  . Not on file   Social History Narrative  . No narrative on file     Filed Vitals:   10/08/14 1311  BP: 114/68  Pulse: 63  Height: 5\' 10"  (1.778 m)  Weight: 194 lb 1.9 oz (88.052 kg)  SpO2: 95%    PHYSICAL EXAM General: NAD HEENT: Normal. Neck: No JVD, no thyromegaly. Lungs: Clear to auscultation bilaterally with normal respiratory effort. CV: Nondisplaced PMI.  Regular rate and rhythm, normal S1/S2, no S3/S4, no  murmur. No pretibial or periankle edema.  No carotid bruit.  Normal pedal pulses.  Abdomen: Soft, nontender, no hepatosplenomegaly, no distention.  Neurologic: Alert and oriented x 3.  Psych: Normal affect. Skin: Normal. Musculoskeletal: Normal range of motion, no gross deformities. Extremities: No clubbing or cyanosis.   ECG: Most recent ECG reviewed.      ASSESSMENT AND PLAN: 1. CAD: Stable ischemic heart disease, nonobstructive. Continue statin and Plavix (ASA intolerant). 2. Hyperlipidemia: Lipids managed by PCP. Continue Lipitor. 3. History of PFO and CVA: Stable. No recurrences. Continue Plavix.  Dispo: f/u 1 year.  [name] [name], M.D., F.A.C.C.

## 2015-02-22 ENCOUNTER — Ambulatory Visit (INDEPENDENT_AMBULATORY_CARE_PROVIDER_SITE_OTHER): Payer: Medicare Other | Admitting: Urology

## 2015-02-22 DIAGNOSIS — R972 Elevated prostate specific antigen [PSA]: Secondary | ICD-10-CM | POA: Diagnosis not present

## 2015-02-22 DIAGNOSIS — N400 Enlarged prostate without lower urinary tract symptoms: Secondary | ICD-10-CM | POA: Diagnosis not present

## 2015-06-14 ENCOUNTER — Other Ambulatory Visit: Payer: Self-pay | Admitting: Urology

## 2015-06-14 ENCOUNTER — Ambulatory Visit (INDEPENDENT_AMBULATORY_CARE_PROVIDER_SITE_OTHER): Payer: Medicare Other | Admitting: Urology

## 2015-06-14 DIAGNOSIS — R1032 Left lower quadrant pain: Secondary | ICD-10-CM

## 2015-06-14 DIAGNOSIS — R109 Unspecified abdominal pain: Secondary | ICD-10-CM | POA: Diagnosis not present

## 2015-06-17 ENCOUNTER — Ambulatory Visit (HOSPITAL_COMMUNITY)
Admission: RE | Admit: 2015-06-17 | Discharge: 2015-06-17 | Disposition: A | Discharge status: Home / Self Care | Payer: Medicare Other | Source: Ambulatory Visit | Attending: Urology | Admitting: Urology

## 2015-06-17 DIAGNOSIS — N508 Other specified disorders of male genital organs: Secondary | ICD-10-CM | POA: Diagnosis present

## 2015-06-17 DIAGNOSIS — M545 Low back pain: Secondary | ICD-10-CM | POA: Insufficient documentation

## 2015-06-17 DIAGNOSIS — R109 Unspecified abdominal pain: Secondary | ICD-10-CM

## 2015-06-17 MED ORDER — IOHEXOL 300 MG/ML  SOLN
100.0000 mL | Freq: Once | INTRAMUSCULAR | Status: AC | PRN
  Administered 2015-06-17: 100 mL via INTRAVENOUS

## 2015-07-16 ENCOUNTER — Encounter (HOSPITAL_COMMUNITY): Payer: Self-pay | Admitting: Emergency Medicine

## 2015-07-16 ENCOUNTER — Emergency Department (HOSPITAL_COMMUNITY)
Admission: EM | Admit: 2015-07-16 | Discharge: 2015-07-16 | Disposition: A | Discharge status: Home / Self Care | Payer: Medicare Other | Attending: Emergency Medicine | Admitting: Emergency Medicine

## 2015-07-16 ENCOUNTER — Emergency Department (HOSPITAL_COMMUNITY): Payer: Medicare Other

## 2015-07-16 DIAGNOSIS — E860 Dehydration: Secondary | ICD-10-CM | POA: Diagnosis present

## 2015-07-16 DIAGNOSIS — Z7902 Long term (current) use of antithrombotics/antiplatelets: Secondary | ICD-10-CM | POA: Insufficient documentation

## 2015-07-16 DIAGNOSIS — E786 Lipoprotein deficiency: Secondary | ICD-10-CM | POA: Insufficient documentation

## 2015-07-16 DIAGNOSIS — Z79899 Other long term (current) drug therapy: Secondary | ICD-10-CM | POA: Insufficient documentation

## 2015-07-16 DIAGNOSIS — I10 Essential (primary) hypertension: Secondary | ICD-10-CM | POA: Diagnosis not present

## 2015-07-16 DIAGNOSIS — B349 Viral infection, unspecified: Secondary | ICD-10-CM | POA: Diagnosis not present

## 2015-07-16 DIAGNOSIS — N400 Enlarged prostate without lower urinary tract symptoms: Secondary | ICD-10-CM | POA: Insufficient documentation

## 2015-07-16 DIAGNOSIS — Z8673 Personal history of transient ischemic attack (TIA), and cerebral infarction without residual deficits: Secondary | ICD-10-CM | POA: Insufficient documentation

## 2015-07-16 DIAGNOSIS — I2510 Atherosclerotic heart disease of native coronary artery without angina pectoris: Secondary | ICD-10-CM | POA: Insufficient documentation

## 2015-07-16 DIAGNOSIS — R509 Fever, unspecified: Secondary | ICD-10-CM

## 2015-07-16 DIAGNOSIS — Q211 Atrial septal defect: Secondary | ICD-10-CM | POA: Diagnosis not present

## 2015-07-16 DIAGNOSIS — R252 Cramp and spasm: Secondary | ICD-10-CM | POA: Diagnosis not present

## 2015-07-16 LAB — URINALYSIS, ROUTINE W REFLEX MICROSCOPIC
BILIRUBIN URINE: NEGATIVE
GLUCOSE, UA: NEGATIVE mg/dL
HGB URINE DIPSTICK: NEGATIVE
Ketones, ur: NEGATIVE mg/dL
Leukocytes, UA: NEGATIVE
Nitrite: NEGATIVE
PH: 5.5 (ref 5.0–8.0)
Protein, ur: NEGATIVE mg/dL
Specific Gravity, Urine: >1.03 — ABNORMAL HIGH (ref 1.005–1.030)
Urobilinogen, UA: 0.2 mg/dL (ref 0.0–1.0)

## 2015-07-16 LAB — CBC WITH DIFFERENTIAL/PLATELET
Basophils Absolute: 0 10*3/uL (ref 0.0–0.1)
Basophils Relative: 0 % (ref 0–1)
EOS ABS: 0 10*3/uL (ref 0.0–0.7)
EOS PCT: 0 % (ref 0–5)
HCT: 43.3 % (ref 39.0–52.0)
Hemoglobin: 15.1 g/dL (ref 13.0–17.0)
LYMPHS ABS: 0.6 10*3/uL — AB (ref 0.7–4.0)
LYMPHS PCT: 21 % (ref 12–46)
MCH: 30.1 pg (ref 26.0–34.0)
MCHC: 34.9 g/dL (ref 30.0–36.0)
MCV: 86.3 fL (ref 78.0–100.0)
Monocytes Absolute: 0.2 10*3/uL (ref 0.1–1.0)
Monocytes Relative: 7 % (ref 3–12)
NEUTROS PCT: 72 % (ref 43–77)
Neutro Abs: 2 10*3/uL (ref 1.7–7.7)
PLATELETS: 85 10*3/uL — AB (ref 150–400)
RBC: 5.02 MIL/uL (ref 4.22–5.81)
RDW: 13.3 % (ref 11.5–15.5)
WBC: 2.8 10*3/uL — AB (ref 4.0–10.5)

## 2015-07-16 LAB — COMPREHENSIVE METABOLIC PANEL
ALK PHOS: 48 U/L (ref 38–126)
ALT: 35 U/L (ref 17–63)
ANION GAP: 7 (ref 5–15)
AST: 29 U/L (ref 15–41)
Albumin: 3.8 g/dL (ref 3.5–5.0)
BUN: 23 mg/dL — ABNORMAL HIGH (ref 6–20)
CALCIUM: 8.6 mg/dL — AB (ref 8.9–10.3)
CO2: 23 mmol/L (ref 22–32)
Chloride: 109 mmol/L (ref 101–111)
Creatinine, Ser: 1.34 mg/dL — ABNORMAL HIGH (ref 0.61–1.24)
GFR calc non Af Amer: 52 mL/min — ABNORMAL LOW (ref >60)
GLUCOSE: 138 mg/dL — AB (ref 65–99)
POTASSIUM: 3.7 mmol/L (ref 3.5–5.1)
Sodium: 139 mmol/L (ref 135–145)
Total Bilirubin: 1.7 mg/dL — ABNORMAL HIGH (ref 0.3–1.2)
Total Protein: 6.1 g/dL — ABNORMAL LOW (ref 6.5–8.1)

## 2015-07-16 MED ORDER — SODIUM CHLORIDE 0.9 % IV BOLUS (SEPSIS)
1000.0000 mL | Freq: Once | INTRAVENOUS | Status: AC
  Administered 2015-07-16: 1000 mL via INTRAVENOUS

## 2015-07-16 NOTE — ED Notes (Signed)
MD Jeneen Rinks at bedside updating patient.

## 2015-07-16 NOTE — ED Notes (Signed)
Pt reminded that a urine sample was needed. Pt verbalized understanding.

## 2015-07-16 NOTE — ED Notes (Signed)
Pt made aware a urine specimen is needed.  

## 2015-07-16 NOTE — ED Notes (Signed)
MD James at bedside.  

## 2015-07-16 NOTE — ED Provider Notes (Addendum)
CSN: [id number]     Arrival date & time 07/16/15  3875 History  This chart was scribed for Tanna Furry, MD by Hansel Feinstein, ED Scribe. This patient was seen in room APA19/APA19 and the patient's care was started at 10:05 AM.     Chief Complaint  Patient presents with  . Dehydration   The history is provided by the patient. No language interpreter was used.    HPI Comments: [name] [name] is a 69 y.o. male with Hx of HTN, cholesterol depletion, CAD, CVA, BPH who presents to the Emergency Department complaining of moderate fever onset 2 days ago (Tmax 101.6). He states associated general malaise, leg cramping (states this is baseline), myalgia, chills, dizziness and nausea. Pt notes that he has been outside for most of the day recently. Pt states that he drinks a lot of water and iced tea to stay hydrated. He has taken Naproxen sodium with mild relief. He is a non-smoker. Denies prostate surgeries, other illness, rash, insect bites, tick bites. He also denies HA, CP, sore throat, SOB, cough, congestion, vomiting, decreased urine, dysuria, changes in color of urine.    Past Medical History  Diagnosis Date  . HTN (hypertension)   . Cholesterol depletion   . CAD (coronary artery disease)     nonobstructive  . CVA (cerebral vascular accident)   . BPH (benign prostatic hyperplasia)   . Patent foramen ovale     on plavix, followed by Dr. Jenell Milliner   Past Surgical History  Procedure Laterality Date  . Colonoscopy  2003    hemorrhoids,otherwise normal.   . Inguinal hernia repair  2006    left  . Gallbladder surgery    . Esophagogastroduodenoscopy  02/26/11    esophageal erosions consistent with mild relfux/small HH, antral erosions bx mild chronic gastritis and NO h.pylori  . Esophagogastroduodenoscopy  09/2010    schatzki ring not manipulated. gastric ulcer with satellite erosions  . Colonoscopy  09/2010    internal hemorrhoids, next TCS in 09/2015   Family History  Problem Relation Age of  Onset  . Colon cancer Brother     ???, has colostomy  . Colon polyps Mother     advanced age, died secondary to AAA rupture   History  Substance Use Topics  . Smoking status: Never Smoker   . Smokeless tobacco: Never Used  . Alcohol Use: No    Review of Systems  Constitutional: Positive for fever and chills. Negative for diaphoresis, appetite change and fatigue.  HENT: Negative for congestion, mouth sores, sore throat and trouble swallowing.   Eyes: Negative for visual disturbance.  Respiratory: Negative for cough, chest tightness, shortness of breath and wheezing.   Cardiovascular: Negative for chest pain.  Gastrointestinal: Positive for nausea. Negative for vomiting, abdominal pain, diarrhea and abdominal distention.  Endocrine: Negative for polydipsia, polyphagia and polyuria.  Genitourinary: Negative for dysuria, frequency, hematuria and decreased urine volume.  Musculoskeletal: Positive for myalgias. Negative for gait problem.       Leg cramping  Skin: Negative for color change, pallor and rash.  Neurological: Positive for dizziness. Negative for syncope, light-headedness and headaches.  Hematological: Does not bruise/bleed easily.  Psychiatric/Behavioral: Negative for behavioral problems and confusion.   Allergies  Asa  Home Medications   Prior to Admission medications   Medication Sig Start Date End Date Taking? Authorizing Provider  atorvastatin (LIPITOR) 20 MG tablet Take 1 tablet (20 mg total) by mouth daily. 10/08/14  Yes Herminio Commons, MD  clopidogrel (  PLAVIX) 75 MG tablet Take 1 tablet (75 mg total) by mouth daily. 10/08/14  Yes Herminio Commons, MD  doxazosin (CARDURA) 8 MG tablet Take 8 mg by mouth at bedtime.   Yes Historical Provider, MD  finasteride (PROSCAR) 5 MG tablet Take 5 mg by mouth daily.     Yes Historical Provider, MD  fish oil-omega-3 fatty acids 1000 MG capsule Take 1 g by mouth daily.    Yes Historical Provider, MD  Flaxseed, Linseed,  (FLAXSEED OIL) 1000 MG CAPS Take 1 capsule by mouth daily.   Yes Historical Provider, MD  Melatonin 10 MG TABS Take 1 tablet by mouth at bedtime.   Yes Historical Provider, MD   BP 126/83 mmHg  Pulse 77  Temp(Src) 98.1 F (36.7 C) (Oral)  Resp 18  Ht 5\' 10"  (1.778 m)  Wt 189 lb (85.73 kg)  BMI 27.12 kg/m2  SpO2 100% Physical Exam  Constitutional: He is oriented to person, place, and time. He appears well-developed and well-nourished. No distress.  HENT:  Head: Normocephalic.  Eyes: Conjunctivae are normal. Pupils are equal, round, and reactive to light. No scleral icterus.  Neck: Normal range of motion. Neck supple. No thyromegaly present.  Cardiovascular: Normal rate and regular rhythm.  Exam reveals no gallop and no friction rub.   No murmur heard. Pulmonary/Chest: Effort normal and breath sounds normal. No respiratory distress. He has no wheezes. He has no rales.  Abdominal: Soft. Bowel sounds are normal. He exhibits no distension. There is no tenderness. There is no rebound.  Musculoskeletal: Normal range of motion.  Neurological: He is alert and oriented to person, place, and time.  Skin: Skin is warm and dry. No rash noted.  Psychiatric: He has a normal mood and affect. His behavior is normal.  Nursing note and vitals reviewed.  ED Course  Procedures (including critical care time) DIAGNOSTIC STUDIES: Oxygen Saturation is 100% on RA, normal by my interpretation.    COORDINATION OF CARE: 10:12 AM Discussed treatment plan with pt at bedside and pt agreed to plan.   Labs Review Labs Reviewed  CBC WITH DIFFERENTIAL/PLATELET - Abnormal; Notable for the following:    WBC 2.8 (*)    Platelets 85 (*)    Lymphs Abs 0.6 (*)    All other components within normal limits  COMPREHENSIVE METABOLIC PANEL - Abnormal; Notable for the following:    Glucose, Bld 138 (*)    BUN 23 (*)    Creatinine, Ser 1.34 (*)    Calcium 8.6 (*)    Total Protein 6.1 (*)    Total Bilirubin 1.7 (*)     GFR calc non Af Amer 52 (*)    All other components within normal limits  URINALYSIS, ROUTINE W REFLEX MICROSCOPIC (NOT AT [facility]) - Abnormal; Notable for the following:    Color, Urine AMBER (*)    APPearance HAZY (*)    Specific Gravity, Urine >1.030 (*)    All other components within normal limits    Imaging Review Dg Chest 2 View  07/16/2015   CLINICAL DATA:  Fever  EXAM: CHEST  2 VIEW  COMPARISON:  05/21/2007  FINDINGS: The heart size and mediastinal contours are within normal limits. Both lungs are clear. The visualized skeletal structures are unremarkable.  IMPRESSION: No active cardiopulmonary disease.   Electronically Signed   By: Rolm Baptise M.D.   On: 07/16/2015 11:09     EKG Interpretation None      MDM   Final diagnoses:  Fever  Viral syndrome   Patient feeling well. No signs of bacterial or separate infection. No surgical criteria. Afebrile here. Plan is home, rest, push fluids, fever control. Attention deficit symptoms and recheck with any evolving or worsening symptoms.   I personally performed the services described in this documentation, which was scribed in my presence. The recorded information has been reviewed and is accurate.   Tanna Furry, MD 07/16/15 1313  Tanna Furry, MD 07/16/15 [phone]

## 2015-07-16 NOTE — ED Notes (Signed)
Pt states that he was sent by his pcp for possible dehydration.  States he has been running a fever the past few days and just not feeling well.

## 2015-07-16 NOTE — Discharge Instructions (Signed)
Rest. Push fluids. Tylenol or Motrin when necessary for fever. Recheck here with your primary care with any additional symptoms including cough, chest pain, abdominal pain, diarrhea, bloody stools, rash, or other changes. Most fevers are from viruses, and run their course without specific treatment.  Fever, Adult A fever is a temperature of 100.4 F (38 C) or above.  HOME CARE  Take fever medicine as told by your doctor. Do not  take aspirin for fever if you are younger than 69 years of age.  If you are given antibiotic medicine, take it as told. Finish the medicine even if you start to feel better.  Rest.  Drink enough fluids to keep your pee (urine) clear or pale yellow. Do not drink alcohol.  Take a bath or shower with room temperature water. Do not use ice water or alcohol sponge baths.  Wear lightweight, loose clothes. GET HELP RIGHT AWAY IF:   You are short of breath or have trouble breathing.  You are very weak.  You are dizzy or you pass out (faint).  You are very thirsty or are making little or no urine.  You have new pain.  You throw up (vomit) or have watery poop (diarrhea).  You keep throwing up or having watery poop for more than 1 to 2 days.  You have a stiff neck or light bothers your eyes.  You have a skin rash.  You have a fever or problems (symptoms) that last for more than 2 to 3 days.  You have a fever and your problems quickly get worse.  You keep throwing up the fluids you drink.  You do not feel better after 3 days.  You have new problems. MAKE SURE YOU:   Understand these instructions.  Will watch your condition.  Will get help right away if you are not doing well or get worse. Document Released: 09/11/2008 Document Revised: 02/25/2012 Document Reviewed: 10/04/2011 [facility] Patient Information 2015 [place], [place]. This information is not intended to replace advice given to you by your health care provider. Make sure you discuss any  questions you have with your health care provider.  Viral Infections A viral infection can be caused by different types of viruses.Most viral infections are not serious and resolve on their own. However, some infections may cause severe symptoms and may lead to further complications. SYMPTOMS Viruses can frequently cause:  Minor sore throat.  Aches and pains.  Headaches.  Runny nose.  Different types of rashes.  Watery eyes.  Tiredness.  Cough.  Loss of appetite.  Gastrointestinal infections, resulting in nausea, vomiting, and diarrhea. These symptoms do not respond to antibiotics because the infection is not caused by bacteria. However, you might catch a bacterial infection following the viral infection. This is sometimes called a "superinfection." Symptoms of such a bacterial infection may include:  Worsening sore throat with pus and difficulty swallowing.  Swollen neck glands.  Chills and a high or persistent fever.  Severe headache.  Tenderness over the sinuses.  Persistent overall ill feeling (malaise), muscle aches, and tiredness (fatigue).  Persistent cough.  Yellow, green, or brown mucus production with coughing. HOME CARE INSTRUCTIONS   Only take over-the-counter or prescription medicines for pain, discomfort, diarrhea, or fever as directed by your caregiver.  Drink enough water and fluids to keep your urine clear or pale yellow. Sports drinks can provide valuable electrolytes, sugars, and hydration.  Get plenty of rest and maintain proper nutrition. Soups and broths with crackers or rice are  fine. SEEK IMMEDIATE MEDICAL CARE IF:   You have severe headaches, shortness of breath, chest pain, neck pain, or an unusual rash.  You have uncontrolled vomiting, diarrhea, or you are unable to keep down fluids.  You or your child has an oral temperature above 102 F (38.9 C), not controlled by medicine.  Your baby is older than 3 months with a rectal  temperature of 102 F (38.9 C) or higher.  Your baby is 73 months old or younger with a rectal temperature of 100.4 F (38 C) or higher. MAKE SURE YOU:   Understand these instructions.  Will watch your condition.  Will get help right away if you are not doing well or get worse. Document Released: 09/12/2005 Document Revised: 02/25/2012 Document Reviewed: 04/09/2011 [facility] Patient Information 2015 [place], [place]. This information is not intended to replace advice given to you by your health care provider. Make sure you discuss any questions you have with your health care provider.

## 2015-09-05 ENCOUNTER — Encounter: Payer: Self-pay | Admitting: Internal Medicine

## 2015-09-26 ENCOUNTER — Ambulatory Visit (INDEPENDENT_AMBULATORY_CARE_PROVIDER_SITE_OTHER): Payer: Medicare Other | Admitting: Cardiovascular Disease

## 2015-09-26 ENCOUNTER — Encounter: Payer: Self-pay | Admitting: Cardiovascular Disease

## 2015-09-26 VITALS — BP 122/60 | HR 97 | Ht 70.0 in | Wt 197.0 lb

## 2015-09-26 DIAGNOSIS — Z8249 Family history of ischemic heart disease and other diseases of the circulatory system: Secondary | ICD-10-CM

## 2015-09-26 DIAGNOSIS — Z8673 Personal history of transient ischemic attack (TIA), and cerebral infarction without residual deficits: Secondary | ICD-10-CM

## 2015-09-26 DIAGNOSIS — E785 Hyperlipidemia, unspecified: Secondary | ICD-10-CM | POA: Diagnosis not present

## 2015-09-26 DIAGNOSIS — Q2112 Patent foramen ovale: Secondary | ICD-10-CM

## 2015-09-26 DIAGNOSIS — Q211 Atrial septal defect: Secondary | ICD-10-CM | POA: Diagnosis not present

## 2015-09-26 DIAGNOSIS — I2510 Atherosclerotic heart disease of native coronary artery without angina pectoris: Secondary | ICD-10-CM | POA: Diagnosis not present

## 2015-09-26 NOTE — Progress Notes (Signed)
Patient ID: [name] [name], male   DOB: 12-Apr-1946, 69 y.o.   MRN: [id number]      SUBJECTIVE: [name] [name] presents for follow up of nonobstructive coronary artery disease, hypertension, hyperlipidemia, history of embolic CVA, and history of patent foramen ovale. He is intolerant of aspirin remains on Plavix. Coronary angiography on 08/03/2004 demonstrated a 50-60% stenosis in the proximal left circumflex coronary artery.  The patient denies any symptoms of chest pain, palpitations, shortness of breath, lightheadedness, dizziness, leg swelling, orthopnea, PND, and syncope.  He felt well going to sleep last night but awoke with myalgias and thinks he may have a virus.  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 [name] [name] lived to the age of 72. She died of a ruptured thoracic aortic aneurysm.  ECG performed in the office today demonstrates normal sinus rhythm with possible old inferior infarct pattern, although he has no known h/o MI, HR 99 beats per minute.   Review of Systems: As per "subjective", otherwise negative.  Allergies  Allergen Reactions  . Asa [Aspirin] Other (See Comments)    Caused ulcer in stomach / GI bleeding     Current Outpatient Prescriptions  Medication Sig Dispense Refill  . atorvastatin (LIPITOR) 20 MG tablet Take 1 tablet (20 mg total) by mouth daily. 90 tablet 3  . clopidogrel (PLAVIX) 75 MG tablet Take 1 tablet (75 mg total) by mouth daily. 90 tablet 3  . doxazosin (CARDURA) 8 MG tablet Take 8 mg by mouth at bedtime.    . finasteride (PROSCAR) 5 MG tablet Take 5 mg by mouth daily.      . fish oil-omega-3 fatty acids 1000 MG capsule Take 1 g by mouth daily.     . Flaxseed, Linseed, (FLAXSEED OIL) 1000 MG CAPS Take 1 capsule by mouth daily.    . Melatonin 10 MG TABS Take 1 tablet by mouth at bedtime.     No current facility-administered medications for this visit.    Past Medical History  Diagnosis Date  . HTN (hypertension)   . Cholesterol depletion   . CAD  (coronary artery disease)     nonobstructive  . CVA (cerebral vascular accident) ([place])   . BPH (benign prostatic hyperplasia)   . Patent foramen ovale     on plavix, followed by Dr. Jenell Milliner    Past Surgical History  Procedure Laterality Date  . Colonoscopy  2003    hemorrhoids,otherwise normal.   . Inguinal hernia repair  2006    left  . Gallbladder surgery    . Esophagogastroduodenoscopy  02/26/11    esophageal erosions consistent with mild relfux/small HH, antral erosions bx mild chronic gastritis and NO h.pylori  . Esophagogastroduodenoscopy  09/2010    schatzki ring not manipulated. gastric ulcer with satellite erosions  . Colonoscopy  09/2010    internal hemorrhoids, next TCS in 09/2015    Social History   Social History  . Marital Status: Married    Spouse Name: N/A  . Number of Children: N/A  . Years of Education: N/A   Occupational History  . retired from [employer] Topics  . Smoking status: Never Smoker   . Smokeless tobacco: Never Used  . Alcohol Use: No  . Drug Use: No  . Sexual Activity: Not on file   Other Topics Concern  . Not on file   Social History Narrative     Filed Vitals:   09/26/15 1048  BP: 122/60  Pulse: 97  Height: 5\' 10"  (1.778 m)  Weight: 197 lb (89.359 kg)  SpO2: 92%    PHYSICAL EXAM General: NAD HEENT: Normal. Neck: No JVD, no thyromegaly. Lungs: Clear to auscultation bilaterally with normal respiratory effort. CV: Nondisplaced PMI.  Regular rate and rhythm, normal S1/S2, no S3/S4, no murmur. No pretibial or periankle edema.  No carotid bruit.  Normal pedal pulses.  Abdomen: Soft, nontender, no hepatosplenomegaly, no distention.  Neurologic: Alert and oriented x 3.  Psych: Normal affect. Skin: Normal. Musculoskeletal: Normal range of motion, no gross deformities. Extremities: No clubbing or cyanosis.   ECG: Most recent ECG reviewed.      ASSESSMENT AND PLAN: 1. CAD: Stable ischemic  heart disease, nonobstructive. Continue statin and Plavix (ASA intolerant).  2. Hyperlipidemia: Lipids managed by PCP. Continue Lipitor.  3. History of PFO and CVA: Stable. No recurrences. Continue Plavix. Obtaining echocardiogram given family history of thoracic aortic aneurysm.  4. Family history of thoracic aortic aneurysm: Will obtain screening echocardiogram.  Dispo: f/u 1 year.   [name], M.D., F.A.C.C.

## 2015-09-26 NOTE — Patient Instructions (Signed)
Your physician has requested that you have an echocardiogram. Echocardiography is a painless test that uses sound waves to create images of your heart. It provides your doctor with information about the size and shape of your heart and how well your heart's chambers and valves are working. This procedure takes approximately one hour. There are no restrictions for this procedure. Office will contact with results via phone or letter.   Continue all current medications. Your physician wants you to follow up in:  1 year.  You will receive a reminder letter in the mail one-two months in advance.  If you don't receive a letter, please call our office to schedule the follow up appointment    

## 2015-09-27 NOTE — Addendum Note (Signed)
Addended by: Julian Hy T on: 09/27/2015 08:04 AM   Modules accepted: Orders

## 2015-09-27 NOTE — Addendum Note (Signed)
Addended by: Julian Hy T on: 09/27/2015 08:03 AM   Modules accepted: Orders

## 2015-10-19 ENCOUNTER — Ambulatory Visit (INDEPENDENT_AMBULATORY_CARE_PROVIDER_SITE_OTHER): Payer: Medicare Other

## 2015-10-19 DIAGNOSIS — Q211 Atrial septal defect: Secondary | ICD-10-CM

## 2015-10-19 DIAGNOSIS — I358 Other nonrheumatic aortic valve disorders: Secondary | ICD-10-CM | POA: Diagnosis not present

## 2015-11-14 ENCOUNTER — Telehealth: Payer: Self-pay | Admitting: *Deleted

## 2015-11-14 NOTE — Telephone Encounter (Signed)
-----   Message from Herminio Commons, MD sent at 11/09/2015  5:09 PM EST ----- Regarding: RE: echo results  Normal pumping function.  ----- Message -----    From: Laurine Blazer, LPN    Sent: [id number]  12:15 PM      To: Herminio Commons, MD Subject: echo results                                   This was done on 10/19/15, but results still say in process.  Think this is one you had trouble looking at results also.    Can you click on blue hyperlink to see results & let me know.  Thanks,  Edd Fabian

## 2015-11-15 NOTE — Telephone Encounter (Signed)
Patient notified.  Copy fwd to pmd.

## 2016-04-05 DIAGNOSIS — I10 Essential (primary) hypertension: Secondary | ICD-10-CM | POA: Diagnosis not present

## 2016-04-05 DIAGNOSIS — E782 Mixed hyperlipidemia: Secondary | ICD-10-CM | POA: Diagnosis not present

## 2016-04-05 DIAGNOSIS — R7301 Impaired fasting glucose: Secondary | ICD-10-CM | POA: Diagnosis not present

## 2016-04-05 DIAGNOSIS — N401 Enlarged prostate with lower urinary tract symptoms: Secondary | ICD-10-CM | POA: Diagnosis not present

## 2016-04-17 DIAGNOSIS — E782 Mixed hyperlipidemia: Secondary | ICD-10-CM | POA: Diagnosis not present

## 2016-04-17 DIAGNOSIS — R944 Abnormal results of kidney function studies: Secondary | ICD-10-CM | POA: Diagnosis not present

## 2016-04-17 DIAGNOSIS — R7301 Impaired fasting glucose: Secondary | ICD-10-CM | POA: Diagnosis not present

## 2016-05-02 ENCOUNTER — Other Ambulatory Visit: Payer: Self-pay | Admitting: Neurology

## 2016-05-02 ENCOUNTER — Telehealth: Payer: Self-pay | Admitting: Cardiovascular Disease

## 2016-05-02 ENCOUNTER — Ambulatory Visit (HOSPITAL_COMMUNITY)
Admission: RE | Admit: 2016-05-02 | Discharge: 2016-05-02 | Disposition: A | Discharge status: Home / Self Care | Payer: PPO | Source: Ambulatory Visit | Attending: Neurology | Admitting: Neurology

## 2016-05-02 DIAGNOSIS — E785 Hyperlipidemia, unspecified: Secondary | ICD-10-CM | POA: Diagnosis not present

## 2016-05-02 DIAGNOSIS — E7800 Pure hypercholesterolemia, unspecified: Secondary | ICD-10-CM | POA: Diagnosis not present

## 2016-05-02 DIAGNOSIS — H532 Diplopia: Secondary | ICD-10-CM | POA: Insufficient documentation

## 2016-05-02 DIAGNOSIS — R42 Dizziness and giddiness: Secondary | ICD-10-CM | POA: Diagnosis not present

## 2016-05-02 DIAGNOSIS — G4733 Obstructive sleep apnea (adult) (pediatric): Secondary | ICD-10-CM | POA: Diagnosis not present

## 2016-05-02 DIAGNOSIS — G459 Transient cerebral ischemic attack, unspecified: Secondary | ICD-10-CM

## 2016-05-02 DIAGNOSIS — I10 Essential (primary) hypertension: Secondary | ICD-10-CM | POA: Diagnosis not present

## 2016-05-02 DIAGNOSIS — I638 Other cerebral infarction: Secondary | ICD-10-CM | POA: Insufficient documentation

## 2016-05-02 DIAGNOSIS — G3184 Mild cognitive impairment, so stated: Secondary | ICD-10-CM | POA: Diagnosis not present

## 2016-05-02 NOTE — Telephone Encounter (Signed)
Patient's wife states that patient had episode last night of feeling really weak and dizzy. Please call patient's wife back for more details. / tg

## 2016-05-02 NOTE — Telephone Encounter (Signed)
Pt has an appointment with [place] today (5/17) @ 2:30. Pt made aware.

## 2016-05-02 NOTE — Telephone Encounter (Signed)
Returned phone call, but no answer. I left message for pt wife to return my call.

## 2016-05-02 NOTE — Telephone Encounter (Signed)
May have had a TIA. Already on Plavix. Would recommend he see a neurologist, today if possible. Neuro may decide to order imaging.

## 2016-05-02 NOTE — Telephone Encounter (Signed)
Pt. Returned my call. He was wanting to let Dr. Bronson Ing know that last night he had went out  to eat dinner and upon returning home he had a spell of dizziness and blurred vision. He stated it only lasted about 2 minutes but he felt so strange, almost like it did back in 2007 when he had his mini stroke. He stated that his bp  Was 150/90 and hr was 80 when this happened. He said after he sat down for a few minutes he felt fine and is feeling fine today. He also said his bp after sitting down  For a few minutes after the episode was 130/72. Today his bp is 150/80 and hr is 57.

## 2016-05-08 ENCOUNTER — Other Ambulatory Visit: Payer: Self-pay | Admitting: Neurology

## 2016-05-08 DIAGNOSIS — G459 Transient cerebral ischemic attack, unspecified: Secondary | ICD-10-CM

## 2016-05-08 DIAGNOSIS — H532 Diplopia: Secondary | ICD-10-CM

## 2016-05-08 DIAGNOSIS — R42 Dizziness and giddiness: Secondary | ICD-10-CM

## 2016-05-09 ENCOUNTER — Ambulatory Visit (HOSPITAL_COMMUNITY)
Admission: RE | Admit: 2016-05-09 | Discharge: 2016-05-09 | Disposition: A | Discharge status: Home / Self Care | Payer: PPO | Source: Ambulatory Visit | Attending: Neurology | Admitting: Neurology

## 2016-05-09 DIAGNOSIS — I6523 Occlusion and stenosis of bilateral carotid arteries: Secondary | ICD-10-CM | POA: Diagnosis not present

## 2016-05-09 DIAGNOSIS — H532 Diplopia: Secondary | ICD-10-CM | POA: Diagnosis not present

## 2016-05-09 DIAGNOSIS — G459 Transient cerebral ischemic attack, unspecified: Secondary | ICD-10-CM | POA: Diagnosis not present

## 2016-05-09 DIAGNOSIS — R42 Dizziness and giddiness: Secondary | ICD-10-CM | POA: Insufficient documentation

## 2016-06-04 DIAGNOSIS — G3184 Mild cognitive impairment, so stated: Secondary | ICD-10-CM | POA: Diagnosis not present

## 2016-06-04 DIAGNOSIS — Q211 Atrial septal defect: Secondary | ICD-10-CM | POA: Diagnosis not present

## 2016-06-04 DIAGNOSIS — G459 Transient cerebral ischemic attack, unspecified: Secondary | ICD-10-CM | POA: Diagnosis not present

## 2016-06-04 DIAGNOSIS — E785 Hyperlipidemia, unspecified: Secondary | ICD-10-CM | POA: Diagnosis not present

## 2016-06-04 DIAGNOSIS — I2510 Atherosclerotic heart disease of native coronary artery without angina pectoris: Secondary | ICD-10-CM | POA: Diagnosis not present

## 2016-06-04 DIAGNOSIS — I10 Essential (primary) hypertension: Secondary | ICD-10-CM | POA: Diagnosis not present

## 2016-06-04 DIAGNOSIS — E7800 Pure hypercholesterolemia, unspecified: Secondary | ICD-10-CM | POA: Diagnosis not present

## 2016-06-04 DIAGNOSIS — G4733 Obstructive sleep apnea (adult) (pediatric): Secondary | ICD-10-CM | POA: Diagnosis not present

## 2016-07-17 ENCOUNTER — Ambulatory Visit (INDEPENDENT_AMBULATORY_CARE_PROVIDER_SITE_OTHER): Payer: PPO | Admitting: Urology

## 2016-07-17 DIAGNOSIS — N401 Enlarged prostate with lower urinary tract symptoms: Secondary | ICD-10-CM | POA: Diagnosis not present

## 2016-10-10 DIAGNOSIS — Z79899 Other long term (current) drug therapy: Secondary | ICD-10-CM | POA: Diagnosis not present

## 2016-10-10 DIAGNOSIS — Z23 Encounter for immunization: Secondary | ICD-10-CM | POA: Diagnosis not present

## 2016-10-10 DIAGNOSIS — R233 Spontaneous ecchymoses: Secondary | ICD-10-CM | POA: Diagnosis not present

## 2016-10-23 ENCOUNTER — Ambulatory Visit (INDEPENDENT_AMBULATORY_CARE_PROVIDER_SITE_OTHER): Payer: PPO | Admitting: Urology

## 2016-10-23 DIAGNOSIS — N401 Enlarged prostate with lower urinary tract symptoms: Secondary | ICD-10-CM | POA: Diagnosis not present

## 2016-10-23 DIAGNOSIS — R300 Dysuria: Secondary | ICD-10-CM

## 2016-11-13 ENCOUNTER — Ambulatory Visit (INDEPENDENT_AMBULATORY_CARE_PROVIDER_SITE_OTHER): Payer: PPO | Admitting: Urology

## 2016-11-13 DIAGNOSIS — R300 Dysuria: Secondary | ICD-10-CM | POA: Diagnosis not present

## 2016-11-13 DIAGNOSIS — N401 Enlarged prostate with lower urinary tract symptoms: Secondary | ICD-10-CM | POA: Diagnosis not present

## 2017-02-14 ENCOUNTER — Telehealth: Payer: Self-pay | Admitting: Cardiovascular Disease

## 2017-02-14 NOTE — Telephone Encounter (Signed)
Numerous attempts to contact patient with recall letters. Unable to reach by telephone. with no success.   [name] [name] [id number] 09/26/2015 11:27 AM New [10]    [System] 06/16/2016 11:00 PM Notification Sent [20]   [name] [name] [id number] 12/06/2016 11:13 AM Notification Sent [20]   [name] [name] [id number] 12/18/2016 10:36 AM Notification Sent [20]   [name] [name] [id number] 02/14/2017 4:09 PM Notification Sent [20]

## 2017-03-25 ENCOUNTER — Ambulatory Visit (INDEPENDENT_AMBULATORY_CARE_PROVIDER_SITE_OTHER): Payer: PPO | Admitting: Cardiovascular Disease

## 2017-03-25 ENCOUNTER — Encounter: Payer: Self-pay | Admitting: Cardiovascular Disease

## 2017-03-25 VITALS — BP 138/72 | HR 58 | Ht 70.0 in | Wt 191.0 lb

## 2017-03-25 DIAGNOSIS — Q2112 Patent foramen ovale: Secondary | ICD-10-CM

## 2017-03-25 DIAGNOSIS — Z8673 Personal history of transient ischemic attack (TIA), and cerebral infarction without residual deficits: Secondary | ICD-10-CM | POA: Diagnosis not present

## 2017-03-25 DIAGNOSIS — Q211 Atrial septal defect: Secondary | ICD-10-CM | POA: Diagnosis not present

## 2017-03-25 DIAGNOSIS — E7800 Pure hypercholesterolemia, unspecified: Secondary | ICD-10-CM

## 2017-03-25 DIAGNOSIS — R030 Elevated blood-pressure reading, without diagnosis of hypertension: Secondary | ICD-10-CM

## 2017-03-25 DIAGNOSIS — I25708 Atherosclerosis of coronary artery bypass graft(s), unspecified, with other forms of angina pectoris: Secondary | ICD-10-CM

## 2017-03-25 DIAGNOSIS — Z8249 Family history of ischemic heart disease and other diseases of the circulatory system: Secondary | ICD-10-CM | POA: Diagnosis not present

## 2017-03-25 NOTE — Patient Instructions (Signed)
Your physician wants you to follow-up in: 1 year Dr Virgina Jock will receive a reminder letter in the mail two months in advance. If you don't receive a letter, please call our office to schedule the follow-up appointment.    Your physician recommends that you continue on your current medications as directed. Please refer to the Current Medication list given to you today.    If you need a refill on your cardiac medications before your next appointment, please call your pharmacy.    Please keep record of daily BP and return to office with results      Thank you for choosing [facility] !

## 2017-03-25 NOTE — Progress Notes (Signed)
SUBJECTIVE: [name] presents for follow up of nonobstructive coronary artery disease, hypertension, hyperlipidemia, history of embolic CVA, and history of patent foramen ovale. He is intolerant of aspirin remains on Plavix. Coronary angiography on 08/03/2004 demonstrated a 50-60% stenosis in the proximal left circumflex coronary artery.  ECG performed in the office today which I ordered and personally interpreted demonstrates normal sinus rhythm with no ischemic ST segment or T-wave abnormalities, nor any arrhythmias.  With regards to coronary artery disease, he has been feeling well and denies exertional chest pain and shortness of breath.  I will order a copy of his lipids performed by his PCP. He says they're controlled.  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 He was under some stress for the past 3-5 weeks and just finished with this. Over those 2-3 weeks, he had noticed blood pressures as high as 160/80. He felt well.  Echocardiogram 10/19/15: Normal left ventricular systolic function, LVEF 06-23%, grade 1 diastolic dysfunction, aortic root upper limits of normal, 3.9 cm. Mild mitral regurgitation and moderate left atrial enlargement.  Review of Systems: As per "subjective", otherwise negative.  Allergies  Allergen Reactions  . Asa [Aspirin] Other (See Comments)    Caused ulcer in stomach / GI bleeding     Current Outpatient Prescriptions  Medication Sig Dispense Refill  . atorvastatin (LIPITOR) 20 MG tablet Take 1 tablet (20 mg total) by mouth daily. 90 tablet 3  . clopidogrel (PLAVIX) 75 MG tablet Take 1 tablet (75 mg total) by mouth daily. 90 tablet 3  . doxazosin (CARDURA) 8 MG tablet Take 8 mg by mouth at bedtime.    . finasteride (PROSCAR) 5 MG tablet Take 5 mg by mouth daily.      . fish oil-omega-3 fatty acids 1000 MG capsule Take 1 g by mouth daily.     . Flaxseed, Linseed, (FLAXSEED OIL) 1000 MG CAPS Take 1  capsule by mouth daily.    . Melatonin 10 MG TABS Take 1 tablet by mouth at bedtime.     No current facility-administered medications for this visit.     Past Medical History:  Diagnosis Date  . BPH (benign prostatic hyperplasia)   . CAD (coronary artery disease)    nonobstructive  . Cholesterol depletion   . CVA (cerebral vascular accident) ([place])   . HTN (hypertension)   . Patent foramen ovale    on plavix, followed by Dr. Jenell Milliner    Past Surgical History:  Procedure Laterality Date  . COLONOSCOPY  2003   hemorrhoids,otherwise normal.   . COLONOSCOPY  09/2010   internal hemorrhoids, next TCS in 09/2015  . ESOPHAGOGASTRODUODENOSCOPY  02/26/11   esophageal erosions consistent with mild relfux/small HH, antral erosions bx mild chronic gastritis and NO h.pylori  . ESOPHAGOGASTRODUODENOSCOPY  09/2010   schatzki ring not manipulated. gastric ulcer with satellite erosions  . GALLBLADDER SURGERY    . INGUINAL HERNIA REPAIR  2006   left    Social History   Social History  . Marital status: Married    Spouse name: N/A  . Number of children: N/A  . Years of education: N/A   Occupational History  . retired from [employer] Topics  . Smoking status: Never Smoker  . Smokeless tobacco: Never Used  . Alcohol use No  . Drug use: No  . Sexual activity: Not on file   Other  Topics Concern  . Not on file   Social History Narrative  . No narrative on file     Vitals:   03/25/17 1301  BP: 138/72  Pulse: (!) 58  SpO2: 98%  Weight: 191 lb (86.6 kg)  Height: 5\' 10"  (1.778 m)    Wt Readings from Last 3 Encounters:  03/25/17 191 lb (86.6 kg)  09/26/15 197 lb (89.4 kg)  07/16/15 189 lb (85.7 kg)     PHYSICAL EXAM General: NAD HEENT: Normal. Neck: No JVD, no thyromegaly. Lungs: Clear to auscultation bilaterally with normal respiratory effort. CV: Nondisplaced PMI.  Regular rate and rhythm, normal S1/S2, no S3/S4, no murmur. No pretibial  or periankle edema.  No carotid bruit.   Abdomen: Soft, nontender, no distention.  Neurologic: Alert and oriented.  Psych: Normal affect. Skin: Normal. Musculoskeletal: No gross deformities.    ECG: Most recent ECG reviewed.   Labs: Lab Results  Component Value Date/Time   K 3.7 07/16/2015 10:38 AM   BUN 23 (H) 07/16/2015 10:38 AM   CREATININE 1.34 (H) 07/16/2015 10:38 AM   ALT 35 07/16/2015 10:38 AM   HGB 15.1 07/16/2015 10:38 AM     Lipids: Lab Results  Component Value Date/Time   LDLCALC 92 06/23/2009 11:31 PM   CHOL 162 06/23/2009 11:31 PM   TRIG 113 06/23/2009 11:31 PM   HDL 47 06/23/2009 11:31 PM       ASSESSMENT AND PLAN: 1. CAD: Symptomatically stable. Continue Plavix as he is ASA intolerant. Continue Lipitor.  2. Hyperlipidemia: Continue Lipitor. I will order a copy of his lipids performed by his PCP. He says they are controlled.  3. History of PFO and CVA: No recurrences. Continue Plavix.  4. Family history of thoracic aortic aneurysm: Aortic root upper limits of normal, 39 mm, on 10/19/15. Will monitor.  5. Elevated BP:  I have asked the patient to check blood pressure readings 4 times per week, at different times throughout the day, in order to get a better approximation of mean BP values. These results will be provided to me at the end of that period so that I can determine if antihypertensive medication titration is indicated.   Disposition: Follow up 1 year.  [name], M.D., F.A.C.C.

## 2017-08-21 DIAGNOSIS — D1800 Hemangioma unspecified site: Secondary | ICD-10-CM | POA: Diagnosis not present

## 2017-08-21 DIAGNOSIS — D225 Melanocytic nevi of trunk: Secondary | ICD-10-CM | POA: Diagnosis not present

## 2017-08-21 DIAGNOSIS — L821 Other seborrheic keratosis: Secondary | ICD-10-CM | POA: Diagnosis not present

## 2017-09-13 DIAGNOSIS — Z23 Encounter for immunization: Secondary | ICD-10-CM | POA: Diagnosis not present

## 2017-11-05 ENCOUNTER — Ambulatory Visit: Payer: PPO | Admitting: Urology

## 2017-11-05 DIAGNOSIS — N401 Enlarged prostate with lower urinary tract symptoms: Secondary | ICD-10-CM

## 2017-11-19 DIAGNOSIS — I10 Essential (primary) hypertension: Secondary | ICD-10-CM | POA: Diagnosis not present

## 2017-11-19 DIAGNOSIS — Z125 Encounter for screening for malignant neoplasm of prostate: Secondary | ICD-10-CM | POA: Diagnosis not present

## 2017-11-19 DIAGNOSIS — E782 Mixed hyperlipidemia: Secondary | ICD-10-CM | POA: Diagnosis not present

## 2017-11-19 DIAGNOSIS — R7301 Impaired fasting glucose: Secondary | ICD-10-CM | POA: Diagnosis not present

## 2017-11-21 DIAGNOSIS — E782 Mixed hyperlipidemia: Secondary | ICD-10-CM | POA: Diagnosis not present

## 2017-11-21 DIAGNOSIS — R7301 Impaired fasting glucose: Secondary | ICD-10-CM | POA: Diagnosis not present

## 2017-11-21 DIAGNOSIS — Z6828 Body mass index (BMI) 28.0-28.9, adult: Secondary | ICD-10-CM | POA: Diagnosis not present

## 2017-11-21 DIAGNOSIS — N401 Enlarged prostate with lower urinary tract symptoms: Secondary | ICD-10-CM | POA: Diagnosis not present

## 2017-11-21 DIAGNOSIS — R944 Abnormal results of kidney function studies: Secondary | ICD-10-CM | POA: Diagnosis not present

## 2018-02-17 DIAGNOSIS — M545 Low back pain: Secondary | ICD-10-CM | POA: Diagnosis not present

## 2018-02-17 DIAGNOSIS — M25551 Pain in right hip: Secondary | ICD-10-CM | POA: Diagnosis not present

## 2018-02-24 DIAGNOSIS — M545 Low back pain: Secondary | ICD-10-CM | POA: Diagnosis not present

## 2018-02-24 DIAGNOSIS — M25551 Pain in right hip: Secondary | ICD-10-CM | POA: Diagnosis not present

## 2018-03-01 DIAGNOSIS — M545 Low back pain: Secondary | ICD-10-CM | POA: Diagnosis not present

## 2018-03-07 DIAGNOSIS — M25551 Pain in right hip: Secondary | ICD-10-CM | POA: Diagnosis not present

## 2018-03-07 DIAGNOSIS — M545 Low back pain: Secondary | ICD-10-CM | POA: Diagnosis not present

## 2018-03-10 ENCOUNTER — Telehealth: Payer: Self-pay | Admitting: Cardiovascular Disease

## 2018-03-10 NOTE — Telephone Encounter (Signed)
He needs an ECG and clinical evaluation. As my schedule is full, please see if he can be evaluated by APP at any office. If chest pain recurs, should go to ED.

## 2018-03-10 NOTE — Telephone Encounter (Signed)
Called patient. No answer. Left message to call back.  

## 2018-03-10 NOTE — Telephone Encounter (Signed)
Patient's wife left message stating that patient had episode of rapid heart rate/palitations with some pain on Friday night. Had another episode of pain on Saturday. States that patient will NOT under any circumstances go to ER. Patient is scheduled to see SK on 4/2. Wants sooner appointment. There is no sooner appointment. Please return call to patient's wife. / tg

## 2018-03-10 NOTE — Telephone Encounter (Signed)
Pt states that he was at the ball game on Fri. And started to have heart palpitations lasting for 30 min. Pt denied chest pain and SOB at that time. He states that on Sat afternoon he experienced an episode of chest pain while working. Pt states that pain was in the center of his chest and did not move anywhere, and denied SOB at that time. Pt BP was 150/75 with HR of 103. Pt stated that the pain went away and he went back to work. On this morning he experienced another episode of chest pain that lasted an hour. Pt rated pain 5/10. Pt again states that the pain is in the middle of the chest and denies SOB, and nausea. Please advise.

## 2018-03-11 NOTE — Telephone Encounter (Signed)
Pt and wife notified of appt with Dr. Bronson Ing on Wed.

## 2018-03-12 ENCOUNTER — Ambulatory Visit: Payer: PPO | Admitting: Cardiovascular Disease

## 2018-03-12 ENCOUNTER — Encounter: Payer: Self-pay | Admitting: Cardiovascular Disease

## 2018-03-12 VITALS — BP 131/67 | HR 70 | Wt 191.8 lb

## 2018-03-12 DIAGNOSIS — I25118 Atherosclerotic heart disease of native coronary artery with other forms of angina pectoris: Secondary | ICD-10-CM

## 2018-03-12 DIAGNOSIS — Q211 Atrial septal defect: Secondary | ICD-10-CM | POA: Diagnosis not present

## 2018-03-12 DIAGNOSIS — Q2112 Patent foramen ovale: Secondary | ICD-10-CM

## 2018-03-12 DIAGNOSIS — E785 Hyperlipidemia, unspecified: Secondary | ICD-10-CM | POA: Diagnosis not present

## 2018-03-12 DIAGNOSIS — R0789 Other chest pain: Secondary | ICD-10-CM

## 2018-03-12 DIAGNOSIS — R079 Chest pain, unspecified: Secondary | ICD-10-CM

## 2018-03-12 DIAGNOSIS — R002 Palpitations: Secondary | ICD-10-CM

## 2018-03-12 NOTE — Patient Instructions (Signed)
Medication Instructions:  Your physician recommends that you continue on your current medications as directed. Please refer to the Current Medication list given to you today.   Labwork: NONE  Testing/Procedures: Your physician has requested that you have an echocardiogram. Echocardiography is a painless test that uses sound waves to create images of your heart. It provides your doctor with information about the size and shape of your heart and how well your heart's chambers and valves are working. This procedure takes approximately one hour. There are no restrictions for this procedure.   Your physician has recommended that you wear an event monitor. Event monitors are medical devices that record the heart's electrical activity. Doctors most often [place] these monitors to diagnose arrhythmias. Arrhythmias are problems with the speed or rhythm of the heartbeat. The monitor is a small, portable device. You can wear one while you do your normal daily activities. This is usually used to diagnose what is causing palpitations/syncope (passing out).   Your physician has requested that you have a lexiscan myoview. For further information please visit [email or web address]. Please follow instruction sheet, as given.    Follow-Up: Your physician recommends that you schedule a follow-up appointment in: 6 WEEKS    Any Other Special Instructions Will Be Listed Below (If Applicable).     If you need a refill on your cardiac medications before your next appointment, please call your pharmacy.

## 2018-03-12 NOTE — Progress Notes (Signed)
SUBJECTIVE: The patient presents for evaluation of chest pain.  He called our office earlier in the week complaining of palpitations and chest pain.  He checked his blood pressure and it was 150/75 with a heart rate of 103 bpm.  He did not go to the ED to have it evaluated.  ECG performed today which I personally reviewed demonstrated sinus rhythm with low voltage.  There were no significant ST segment or T wave abnormalities concerning for ischemia.  Coronary angiography on 08/03/2004 demonstrated a 50-60% stenosis in the proximal left circumflex coronary artery.  He also has a history of hypertension, hyperlipidemia, embolic CVA, and PFO.  He is intolerant of aspirin and remains on Plavix.  Echocardiogram 10/19/15: Normal left ventricular systolic function, LVEF 34-19%, grade 1 diastolic dysfunction, aortic root upper limits of normal, 3.9 cm. Mild mitral regurgitation and moderate left atrial enlargement.  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  It subsided with rest and has not recurred.  He is also been expensing palpitations every 3-5 days.  One episode lasted about 30 minutes and last night an episode lasted 10 minutes while he was sitting down watching high school football game.  He denies shortness of breath, orthopnea, dizziness, leg swelling, and paroxysmal nocturnal dyspnea.  He is here with his wife, Tomi Bamberger, who is also my patient.  She is now using CPAP and feels much better.  She is more energetic.     Review of Systems: As per "subjective", otherwise negative.  Allergies  Allergen Reactions  . Asa [Aspirin] Other (See Comments)    Caused ulcer in stomach / GI bleeding     Current Outpatient Medications  Medication Sig Dispense Refill  . atorvastatin (LIPITOR) 20 MG tablet Take 1 tablet (20 mg total) by mouth daily. 90 tablet 3  . clopidogrel (PLAVIX) 75 MG tablet Take 1 tablet (75 mg total) by  mouth daily. 90 tablet 3  . doxazosin (CARDURA) 8 MG tablet Take 8 mg by mouth at bedtime.    . finasteride (PROSCAR) 5 MG tablet Take 5 mg by mouth daily.      . fish oil-omega-3 fatty acids 1000 MG capsule Take 1 g by mouth daily.     . Flaxseed, Linseed, (FLAXSEED OIL) 1000 MG CAPS Take 1 capsule by mouth daily.    . Melatonin 10 MG TABS Take 1 tablet by mouth at bedtime.     No current facility-administered medications for this visit.     Past Medical History:  Diagnosis Date  . BPH (benign prostatic hyperplasia)   . CAD (coronary artery disease)    nonobstructive  . Cholesterol depletion   . CVA (cerebral vascular accident) ([place])   . HTN (hypertension)   . Patent foramen ovale    on plavix, followed by Dr. Jenell Milliner    Past Surgical History:  Procedure Laterality Date  . COLONOSCOPY  2003   hemorrhoids,otherwise normal.   . COLONOSCOPY  09/2010   internal hemorrhoids, next TCS in 09/2015  . ESOPHAGOGASTRODUODENOSCOPY  02/26/11   esophageal erosions consistent with mild relfux/small HH, antral erosions bx mild chronic gastritis and NO h.pylori  . ESOPHAGOGASTRODUODENOSCOPY  09/2010   schatzki ring not manipulated. gastric ulcer with satellite erosions  . GALLBLADDER SURGERY    . INGUINAL HERNIA REPAIR  2006   left    Social History   Socioeconomic History  . Marital status: Married  Spouse name: Not on file  . Number of children: Not on file  . Years of education: Not on file  . Highest education level: Not on file  Occupational History  . Occupation: retired from [employer]  . Financial resource strain: Not on file  . Food insecurity:    Worry: Not on file    Inability: Not on file  . Transportation needs:    Medical: Not on file    Non-medical: Not on file  Tobacco Use  . Smoking status: Never Smoker  . Smokeless tobacco: Never Used  Substance and Sexual Activity  . Alcohol use: No  . Drug use: No  . Sexual activity: Not on file    Lifestyle  . Physical activity:    Days per week: Not on file    Minutes per session: Not on file  . Stress: Not on file  Relationships  . Social connections:    Talks on phone: Not on file    Gets together: Not on file    Attends religious service: Not on file    Active member of club or organization: Not on file    Attends meetings of clubs or organizations: Not on file    Relationship status: Not on file  . Intimate partner violence:    Fear of current or ex partner: Not on file    Emotionally abused: Not on file    Physically abused: Not on file    Forced sexual activity: Not on file  Other Topics Concern  . Not on file  Social History Narrative  . Not on file     Vitals:   03/12/18 1144  BP: 131/67  Pulse: 70  SpO2: 98%  Weight: 191 lb 12.8 oz (87 kg)    Wt Readings from Last 3 Encounters:  03/12/18 191 lb 12.8 oz (87 kg)  03/25/17 191 lb (86.6 kg)  09/26/15 197 lb (89.4 kg)     PHYSICAL EXAM General: NAD HEENT: Normal. Neck: No JVD, no thyromegaly. Lungs: Clear to auscultation bilaterally with normal respiratory effort. CV: Regular rate and rhythm, normal S1/S2, no S3/S4, no murmur. No pretibial or periankle edema.  No carotid bruit.   Abdomen: Soft, nontender, no distention.  Neurologic: Alert and oriented.  Psych: Normal affect. Skin: Normal. Musculoskeletal: No gross deformities.    ECG: Most recent ECG reviewed.   Labs: Lab Results  Component Value Date/Time   K 3.7 07/16/2015 10:38 AM   BUN 23 (H) 07/16/2015 10:38 AM   CREATININE 1.34 (H) 07/16/2015 10:38 AM   ALT 35 07/16/2015 10:38 AM   HGB 15.1 07/16/2015 10:38 AM     Lipids: Lab Results  Component Value Date/Time   LDLCALC 92 06/23/2009 11:31 PM   CHOL 162 06/23/2009 11:31 PM   TRIG 113 06/23/2009 11:31 PM   HDL 47 06/23/2009 11:31 PM       ASSESSMENT AND PLAN: 1.  Chest pain in the context of coronary artery disease: In 2005, he had a 50-60% stenosis of the proximal left  circumflex.  I wonder if this has progressed to the point of becoming hemodynamically significant.  He is intolerant of aspirin and remains on Plavix.  He is also on Lipitor. I will obtain an exercise Myoview stress test.  2.  Palpitations: He has a prior history of CVA.  He had moderate left atrial enlargement by his last echocardiogram.  I will obtain an echocardiogram to assess for interval changes in cardiac structure  and function.  I will also obtain a 2-week event monitor to assess for frequent premature atrial and/or ventricular contractions, potential atrial fibrillation, ATC.  3.  Hyperlipidemia: Continue Lipitor.  4.  History of PFO and CVA: No recurrences.  Continue Plavix.  I will obtain an echocardiogram with an agitated saline bubble study.   Disposition: Follow up 6 weeks.   [name], M.D., F.A.C.C.

## 2018-03-17 ENCOUNTER — Ambulatory Visit (INDEPENDENT_AMBULATORY_CARE_PROVIDER_SITE_OTHER): Payer: PPO

## 2018-03-17 DIAGNOSIS — R002 Palpitations: Secondary | ICD-10-CM | POA: Diagnosis not present

## 2018-03-18 ENCOUNTER — Ambulatory Visit: Payer: PPO | Admitting: Cardiovascular Disease

## 2018-03-19 ENCOUNTER — Encounter (HOSPITAL_COMMUNITY): Payer: Self-pay

## 2018-03-19 ENCOUNTER — Ambulatory Visit (HOSPITAL_COMMUNITY)
Admission: RE | Admit: 2018-03-19 | Discharge: 2018-03-19 | Disposition: A | Discharge status: Home / Self Care | Payer: PPO | Source: Ambulatory Visit | Attending: Cardiovascular Disease | Admitting: Cardiovascular Disease

## 2018-03-19 ENCOUNTER — Encounter (HOSPITAL_COMMUNITY)
Admission: RE | Admit: 2018-03-19 | Discharge: 2018-03-19 | Disposition: A | Discharge status: Home / Self Care | Payer: PPO | Source: Ambulatory Visit | Attending: Cardiovascular Disease | Admitting: Cardiovascular Disease

## 2018-03-19 ENCOUNTER — Telehealth: Payer: Self-pay | Admitting: *Deleted

## 2018-03-19 DIAGNOSIS — I10 Essential (primary) hypertension: Secondary | ICD-10-CM | POA: Diagnosis not present

## 2018-03-19 DIAGNOSIS — K219 Gastro-esophageal reflux disease without esophagitis: Secondary | ICD-10-CM | POA: Insufficient documentation

## 2018-03-19 DIAGNOSIS — E785 Hyperlipidemia, unspecified: Secondary | ICD-10-CM | POA: Diagnosis not present

## 2018-03-19 DIAGNOSIS — R079 Chest pain, unspecified: Secondary | ICD-10-CM | POA: Diagnosis not present

## 2018-03-19 DIAGNOSIS — I519 Heart disease, unspecified: Secondary | ICD-10-CM | POA: Diagnosis not present

## 2018-03-19 LAB — NM MYOCAR MULTI W/SPECT W/WALL MOTION / EF
CHL CUP NUCLEAR SRS: 1
CSEPEW: 10.1 METS
CSEPHR: 88 %
Exercise duration (min): 9 min
Exercise duration (sec): 31 s
LV dias vol: 83 mL (ref 62–150)
LV sys vol: 33 mL
MPHR: 148 {beats}/min
NUC STRESS TID: 0.79
Peak HR: 131 {beats}/min
RATE: 0.41
RPE: 13
Rest HR: 61 {beats}/min
SDS: 5
SSS: 6

## 2018-03-19 MED ORDER — SODIUM CHLORIDE 0.9% FLUSH
INTRAVENOUS | Status: AC
  Administered 2018-03-19: 10 mL via INTRAVENOUS
  Filled 2018-03-19: qty 10

## 2018-03-19 MED ORDER — TECHNETIUM TC 99M TETROFOSMIN IV KIT
10.0000 | PACK | Freq: Once | INTRAVENOUS | Status: AC | PRN
  Administered 2018-03-19: 10.9 via INTRAVENOUS

## 2018-03-19 MED ORDER — TECHNETIUM TC 99M TETROFOSMIN IV KIT
30.0000 | PACK | Freq: Once | INTRAVENOUS | Status: AC | PRN
  Administered 2018-03-19: 32 via INTRAVENOUS

## 2018-03-19 MED ORDER — REGADENOSON 0.4 MG/5ML IV SOLN
INTRAVENOUS | Status: AC
  Filled 2018-03-19: qty 5

## 2018-03-19 NOTE — Telephone Encounter (Signed)
Notes recorded by Laurine Blazer, LPN on 06/23/2422 at 5:36 PM EDT Patient notified. Copy to pmd. Follow up scheduled for 04/21/2018 with Dr. Shawna Orleans - Linna Hoff.   ------  Notes recorded by Herminio Commons, MD on 03/19/2018 at 3:31 PM EDT Low risk study overall. Findings demonstrated previous heart attack involving the bottom wall of the heart. There are no new blockages. Continue Plavix and Lipitor.

## 2018-03-19 NOTE — Progress Notes (Signed)
*  PRELIMINARY RESULTS* Echocardiogram 2D Echocardiogram has been performed.  [name] [name] 03/19/2018, 9:36 AM

## 2018-03-31 ENCOUNTER — Encounter: Payer: Self-pay | Admitting: Cardiovascular Disease

## 2018-03-31 ENCOUNTER — Telehealth: Payer: Self-pay | Admitting: Cardiovascular Disease

## 2018-03-31 ENCOUNTER — Ambulatory Visit (INDEPENDENT_AMBULATORY_CARE_PROVIDER_SITE_OTHER): Payer: PPO | Admitting: Cardiovascular Disease

## 2018-03-31 VITALS — BP 136/64 | HR 68 | Ht 70.0 in | Wt 189.0 lb

## 2018-03-31 DIAGNOSIS — I25118 Atherosclerotic heart disease of native coronary artery with other forms of angina pectoris: Secondary | ICD-10-CM

## 2018-03-31 DIAGNOSIS — Q211 Atrial septal defect: Secondary | ICD-10-CM

## 2018-03-31 DIAGNOSIS — E785 Hyperlipidemia, unspecified: Secondary | ICD-10-CM

## 2018-03-31 DIAGNOSIS — Z8673 Personal history of transient ischemic attack (TIA), and cerebral infarction without residual deficits: Secondary | ICD-10-CM

## 2018-03-31 DIAGNOSIS — R002 Palpitations: Secondary | ICD-10-CM | POA: Diagnosis not present

## 2018-03-31 DIAGNOSIS — Q2112 Patent foramen ovale: Secondary | ICD-10-CM

## 2018-03-31 MED ORDER — NITROGLYCERIN 0.4 MG SL SUBL: 0.4000 mg | SUBLINGUAL_TABLET | SUBLINGUAL | 3 refills | Status: AC | PRN

## 2018-03-31 MED ORDER — ATORVASTATIN CALCIUM 80 MG PO TABS: 80.0000 mg | ORAL_TABLET | Freq: Every day | ORAL | 3 refills | Status: DC

## 2018-03-31 NOTE — Patient Instructions (Addendum)
Your physician wants you to follow-up in: 6 months with Dr Virgina Jock will receive a reminder letter in the mail two months in advance. If you don't receive a letter, please call our office to schedule the follow-up appointment.     INCREASE Lipitor to 80 mg daily    Use nitroglycerine as directed   No lab work or tests ordered today     Nitroglycerin sublingual tablets What is this medicine? NITROGLYCERIN (nye troe GLI ser in) is a type of vasodilator. It relaxes blood vessels, increasing the blood and oxygen supply to your heart. This medicine is used to relieve chest pain caused by angina. It is also used to prevent chest pain before activities like climbing stairs, going outdoors in cold weather, or sexual activity. This medicine may be used for other purposes; ask your health care provider or pharmacist if you have questions. COMMON BRAND NAME(S): Nitroquick, Nitrostat, Nitrotab What should I tell my health care provider before I take this medicine? They need to know if you have any of these conditions: -anemia -head injury, recent stroke, or bleeding in the brain -liver disease -previous heart attack -an unusual or allergic reaction to nitroglycerin, other medicines, foods, dyes, or preservatives -pregnant or trying to get pregnant -breast-feeding How should I use this medicine? Take this medicine by mouth as needed. At the first sign of an angina attack (chest pain or tightness) place one tablet under your tongue. You can also take this medicine 5 to 10 minutes before an event likely to produce chest pain. Follow the directions on the prescription label. Let the tablet dissolve under the tongue. Do not swallow whole. Replace the dose if you accidentally swallow it. It will help if your mouth is not dry. Saliva around the tablet will help it to dissolve more quickly. Do not eat or drink, smoke or chew tobacco while a tablet is dissolving. If you are not better within 5  minutes after taking ONE dose of nitroglycerin, call 9-1-1 immediately to seek emergency medical care. Do not take more than 3 nitroglycerin tablets over 15 minutes. If you take this medicine often to relieve symptoms of angina, your doctor or health care professional may provide you with different instructions to manage your symptoms. If symptoms do not go away after following these instructions, it is important to call 9-1-1 immediately. Do not take more than 3 nitroglycerin tablets over 15 minutes. Talk to your pediatrician regarding the use of this medicine in children. Special care may be needed. Overdosage: If you think you have taken too much of this medicine contact a poison control center or emergency room at once. NOTE: This medicine is only for you. Do not share this medicine with others. What if I miss a dose? This does not apply. This medicine is only used as needed. What may interact with this medicine? Do not take this medicine with any of the following medications: -certain migraine medicines like ergotamine and dihydroergotamine (DHE) -medicines used to treat erectile dysfunction like sildenafil, tadalafil, and vardenafil -riociguat This medicine may also interact with the following medications: -alteplase -aspirin -heparin -medicines for high blood pressure -medicines for mental depression -other medicines used to treat angina -phenothiazines like chlorpromazine, mesoridazine, prochlorperazine, thioridazine This list may not describe all possible interactions. Give your health care provider a list of all the medicines, herbs, non-prescription drugs, or dietary supplements you use. Also tell them if you smoke, drink alcohol, or use illegal drugs. Some items may interact with  your medicine. What should I watch for while using this medicine? Tell your doctor or health care professional if you feel your medicine is no longer working. Keep this medicine with you at all times. Sit  or lie down when you take your medicine to prevent falling if you feel dizzy or faint after using it. Try to remain calm. This will help you to feel better faster. If you feel dizzy, take several deep breaths and lie down with your feet propped up, or bend forward with your head resting between your knees. You may get drowsy or dizzy. Do not drive, use machinery, or do anything that needs mental alertness until you know how this drug affects you. Do not stand or sit up quickly, especially if you are an older patient. This reduces the risk of dizzy or fainting spells. Alcohol can make you more drowsy and dizzy. Avoid alcoholic drinks. Do not treat yourself for coughs, colds, or pain while you are taking this medicine without asking your doctor or health care professional for advice. Some ingredients may increase your blood pressure. What side effects may I notice from receiving this medicine? Side effects that you should report to your doctor or health care professional as soon as possible: -blurred vision -dry mouth -skin rash -sweating -the feeling of extreme pressure in the head -unusually weak or tired Side effects that usually do not require medical attention (report to your doctor or health care professional if they continue or are bothersome): -flushing of the face or neck -headache -irregular heartbeat, palpitations -nausea, vomiting This list may not describe all possible side effects. Call your doctor for medical advice about side effects. You may report side effects to FDA at 1-800-FDA-1088. Where should I keep my medicine? Keep out of the reach of children. Store at room temperature between 20 and 25 degrees C (68 and 77 degrees F). Store in [facility] of [facility]. Protect from light and moisture. Keep tightly closed. Throw away any unused medicine after the expiration date. NOTE: This sheet is a summary. It may not cover all possible information. If you have questions about this medicine,  talk to your doctor, pharmacist, or health care provider.  2018 Elsevier/Gold Standard (2013-10-01 17:57:36)

## 2018-03-31 NOTE — Progress Notes (Signed)
SUBJECTIVE: The patient returns for follow-up after undergoing cardiovascular testing performed for the evaluation of chest pain and palpitations.  Nuclear stress test findings were consistent with a large prior inferior/inferoseptal myocardial infarction with no evidence of ischemia.  Duke treadmill score was 9.5 and low risk.  Echocardiogram on 03/19/18 showed normal left ventricular systolic function and regional wall motion, LVEF 55%, mild LVH, grade 1 diastolic dysfunction, and an intra-atrial shunt consistent with PFO or secundum ASD.  Pulmonary pressures were normal.  Coronary angiography on 08/03/2004 demonstrated a 50-60% stenosis in the proximal left circumflex coronary artery.  He also has a history of hypertension, hyperlipidemia, embolic CVA, and PFO.  He is intolerant of aspirin and remains on Plavix.  The patient denies any symptoms of chest pain, shortness of breath, lightheadedness, dizziness, leg swelling, orthopnea, PND, and syncope.  He continues to experience intermittent palpitations.  His wife has several questions about potential mechanisms of her husband's MI.     Review of Systems: As per "subjective", otherwise negative.  Allergies  Allergen Reactions  . Asa [Aspirin] Other (See Comments)    Caused ulcer in stomach / GI bleeding     Current Outpatient Medications  Medication Sig Dispense Refill  . atorvastatin (LIPITOR) 20 MG tablet Take 1 tablet (20 mg total) by mouth daily. 90 tablet 3  . clopidogrel (PLAVIX) 75 MG tablet Take 1 tablet (75 mg total) by mouth daily. 90 tablet 3  . doxazosin (CARDURA) 8 MG tablet Take 8 mg by mouth at bedtime.    . finasteride (PROSCAR) 5 MG tablet Take 5 mg by mouth daily.      . fish oil-omega-3 fatty acids 1000 MG capsule Take 1 g by mouth daily.     . Flaxseed, Linseed, (FLAXSEED OIL) 1000 MG CAPS Take 1 capsule by mouth daily.    . Melatonin 10 MG TABS Take 1 tablet by mouth at bedtime.     No current  facility-administered medications for this visit.     Past Medical History:  Diagnosis Date  . BPH (benign prostatic hyperplasia)   . CAD (coronary artery disease)    nonobstructive  . Cholesterol depletion   . CVA (cerebral vascular accident) ([place])   . HTN (hypertension)   . Patent foramen ovale    on plavix, followed by Dr. Jenell Milliner    Past Surgical History:  Procedure Laterality Date  . COLONOSCOPY  2003   hemorrhoids,otherwise normal.   . COLONOSCOPY  09/2010   internal hemorrhoids, next TCS in 09/2015  . ESOPHAGOGASTRODUODENOSCOPY  02/26/11   esophageal erosions consistent with mild relfux/small HH, antral erosions bx mild chronic gastritis and NO h.pylori  . ESOPHAGOGASTRODUODENOSCOPY  09/2010   schatzki ring not manipulated. gastric ulcer with satellite erosions  . GALLBLADDER SURGERY    . INGUINAL HERNIA REPAIR  2006   left    Social History   Socioeconomic History  . Marital status: Married    Spouse name: Not on file  . Number of children: Not on file  . Years of education: Not on file  . Highest education level: Not on file  Occupational History  . Occupation: retired from [employer]  . Financial resource strain: Not on file  . Food insecurity:    Worry: Not on file    Inability: Not on file  . Transportation needs:    Medical: Not on file    Non-medical: Not on file  Tobacco Use  .  Smoking status: Never Smoker  . Smokeless tobacco: Never Used  Substance and Sexual Activity  . Alcohol use: No  . Drug use: No  . Sexual activity: Not on file  Lifestyle  . Physical activity:    Days per week: Not on file    Minutes per session: Not on file  . Stress: Not on file  Relationships  . Social connections:    Talks on phone: Not on file    Gets together: Not on file    Attends religious service: Not on file    Active member of club or organization: Not on file    Attends meetings of clubs or organizations: Not on file     Relationship status: Not on file  . Intimate partner violence:    Fear of current or ex partner: Not on file    Emotionally abused: Not on file    Physically abused: Not on file    Forced sexual activity: Not on file  Other Topics Concern  . Not on file  Social History Narrative  . Not on file     Vitals:   03/31/18 1259  BP: 136/64  Pulse: 68  SpO2: 95%  Weight: 189 lb (85.7 kg)  Height: 5\' 10"  (1.778 m)    Wt Readings from Last 3 Encounters:  03/31/18 189 lb (85.7 kg)  03/12/18 191 lb 12.8 oz (87 kg)  03/25/17 191 lb (86.6 kg)     PHYSICAL EXAM General: NAD HEENT: Normal. Neck: No JVD, no thyromegaly. Lungs: Clear to auscultation bilaterally with normal respiratory effort. CV: Regular rate and rhythm, normal S1/S2, no S3/S4, no murmur. No pretibial or periankle edema.  No carotid bruit.   Abdomen: Soft, nontender, no distention.  Neurologic: Alert and oriented.  Psych: Normal affect. Skin: Normal. Musculoskeletal: No gross deformities.    ECG: Most recent ECG reviewed.   Labs: Lab Results  Component Value Date/Time   K 3.7 07/16/2015 10:38 AM   BUN 23 (H) 07/16/2015 10:38 AM   CREATININE 1.34 (H) 07/16/2015 10:38 AM   ALT 35 07/16/2015 10:38 AM   HGB 15.1 07/16/2015 10:38 AM     Lipids: Lab Results  Component Value Date/Time   LDLCALC 92 06/23/2009 11:31 PM   CHOL 162 06/23/2009 11:31 PM   TRIG 113 06/23/2009 11:31 PM   HDL 47 06/23/2009 11:31 PM       ASSESSMENT AND PLAN: 1.  Chest pain in the context of coronary artery disease: Symptomatically stable.  Evidence of prior myocardial infarction with no evidence of ischemia by recent nuclear stress test as detailed above.  In 2005, he had a 50-60% stenosis of the proximal left circumflex.  He is intolerant of aspirin and remains on Plavix.  He is also on Lipitor.  I will increase Lipitor to 80 mg and also prescribe sublingual nitroglycerin.  2.  Palpitations: He has a prior history of CVA.  He  had moderate left atrial enlargement by his last echocardiogram.    I am awaiting results of event monitoring which he mailed back today.  3.  Hyperlipidemia: I will increase Lipitor to 80 mg given history of MI.  4.  History of PFO and CVA: No recurrences.  Continue Plavix.  Echocardiogram confirmed evidence of a PFO as detailed above.      Disposition: Follow up 6 months   [name], M.D., F.A.C.C.

## 2018-03-31 NOTE — Telephone Encounter (Signed)
Spoke with pt's wife. She would like to talk with DR. Koneswaran concerning her husbands test results. Made an appt for this afternoon.

## 2018-03-31 NOTE — Telephone Encounter (Signed)
Pt's wife has some questions regarding his test results

## 2018-04-21 ENCOUNTER — Ambulatory Visit: Payer: PPO | Admitting: Cardiovascular Disease

## 2018-06-03 DIAGNOSIS — R7301 Impaired fasting glucose: Secondary | ICD-10-CM | POA: Diagnosis not present

## 2018-06-03 DIAGNOSIS — I10 Essential (primary) hypertension: Secondary | ICD-10-CM | POA: Diagnosis not present

## 2018-06-03 DIAGNOSIS — E782 Mixed hyperlipidemia: Secondary | ICD-10-CM | POA: Diagnosis not present

## 2018-06-06 DIAGNOSIS — I2510 Atherosclerotic heart disease of native coronary artery without angina pectoris: Secondary | ICD-10-CM | POA: Diagnosis not present

## 2018-06-06 DIAGNOSIS — R7301 Impaired fasting glucose: Secondary | ICD-10-CM | POA: Diagnosis not present

## 2018-06-06 DIAGNOSIS — Z6826 Body mass index (BMI) 26.0-26.9, adult: Secondary | ICD-10-CM | POA: Diagnosis not present

## 2018-06-06 DIAGNOSIS — E782 Mixed hyperlipidemia: Secondary | ICD-10-CM | POA: Diagnosis not present

## 2018-06-06 DIAGNOSIS — N401 Enlarged prostate with lower urinary tract symptoms: Secondary | ICD-10-CM | POA: Diagnosis not present

## 2018-06-06 DIAGNOSIS — R944 Abnormal results of kidney function studies: Secondary | ICD-10-CM | POA: Diagnosis not present

## 2018-07-08 DIAGNOSIS — I2510 Atherosclerotic heart disease of native coronary artery without angina pectoris: Secondary | ICD-10-CM | POA: Diagnosis not present

## 2018-07-08 DIAGNOSIS — I10 Essential (primary) hypertension: Secondary | ICD-10-CM | POA: Diagnosis not present

## 2018-07-08 DIAGNOSIS — K219 Gastro-esophageal reflux disease without esophagitis: Secondary | ICD-10-CM | POA: Diagnosis not present

## 2018-07-08 DIAGNOSIS — G459 Transient cerebral ischemic attack, unspecified: Secondary | ICD-10-CM | POA: Diagnosis not present

## 2018-07-08 DIAGNOSIS — E782 Mixed hyperlipidemia: Secondary | ICD-10-CM | POA: Diagnosis not present

## 2018-07-15 ENCOUNTER — Telehealth: Payer: Self-pay | Admitting: Cardiovascular Disease

## 2018-07-15 NOTE — Telephone Encounter (Signed)
Amy called to clarify Lipitor dose.

## 2018-07-15 NOTE — Telephone Encounter (Signed)
Please call Amy w/ Dr. Durene Cal office concerning the pt's medication [phone]

## 2018-09-22 DIAGNOSIS — Z23 Encounter for immunization: Secondary | ICD-10-CM | POA: Diagnosis not present

## 2018-10-27 DIAGNOSIS — I10 Essential (primary) hypertension: Secondary | ICD-10-CM | POA: Diagnosis not present

## 2018-10-27 DIAGNOSIS — E782 Mixed hyperlipidemia: Secondary | ICD-10-CM | POA: Diagnosis not present

## 2018-10-28 ENCOUNTER — Other Ambulatory Visit: Payer: Self-pay | Admitting: *Deleted

## 2018-10-28 MED ORDER — CLOPIDOGREL BISULFATE 75 MG PO TABS: 75.0000 mg | ORAL_TABLET | Freq: Every day | ORAL | 3 refills | Status: DC

## 2018-10-28 MED ORDER — ATORVASTATIN CALCIUM 80 MG PO TABS: 80.0000 mg | ORAL_TABLET | Freq: Every day | ORAL | 3 refills | Status: DC

## 2018-12-02 ENCOUNTER — Ambulatory Visit (INDEPENDENT_AMBULATORY_CARE_PROVIDER_SITE_OTHER): Payer: PPO | Admitting: Urology

## 2018-12-02 DIAGNOSIS — R351 Nocturia: Secondary | ICD-10-CM | POA: Diagnosis not present

## 2018-12-02 DIAGNOSIS — N401 Enlarged prostate with lower urinary tract symptoms: Secondary | ICD-10-CM

## 2018-12-08 ENCOUNTER — Ambulatory Visit (INDEPENDENT_AMBULATORY_CARE_PROVIDER_SITE_OTHER): Payer: PPO | Admitting: Otolaryngology

## 2018-12-08 DIAGNOSIS — R0982 Postnasal drip: Secondary | ICD-10-CM

## 2018-12-08 DIAGNOSIS — H903 Sensorineural hearing loss, bilateral: Secondary | ICD-10-CM | POA: Diagnosis not present

## 2018-12-18 DIAGNOSIS — D485 Neoplasm of uncertain behavior of skin: Secondary | ICD-10-CM | POA: Diagnosis not present

## 2018-12-18 DIAGNOSIS — Z23 Encounter for immunization: Secondary | ICD-10-CM | POA: Diagnosis not present

## 2018-12-31 DIAGNOSIS — I2510 Atherosclerotic heart disease of native coronary artery without angina pectoris: Secondary | ICD-10-CM | POA: Diagnosis not present

## 2018-12-31 DIAGNOSIS — K219 Gastro-esophageal reflux disease without esophagitis: Secondary | ICD-10-CM | POA: Diagnosis not present

## 2018-12-31 DIAGNOSIS — E782 Mixed hyperlipidemia: Secondary | ICD-10-CM | POA: Diagnosis not present

## 2018-12-31 DIAGNOSIS — R7301 Impaired fasting glucose: Secondary | ICD-10-CM | POA: Diagnosis not present

## 2019-01-23 ENCOUNTER — Encounter: Payer: Self-pay | Admitting: Cardiovascular Disease

## 2019-01-23 ENCOUNTER — Ambulatory Visit: Payer: PPO | Admitting: Cardiovascular Disease

## 2019-01-23 VITALS — BP 112/68 | HR 63 | Ht 70.0 in | Wt 183.0 lb

## 2019-01-23 DIAGNOSIS — E785 Hyperlipidemia, unspecified: Secondary | ICD-10-CM | POA: Diagnosis not present

## 2019-01-23 DIAGNOSIS — Z8673 Personal history of transient ischemic attack (TIA), and cerebral infarction without residual deficits: Secondary | ICD-10-CM | POA: Diagnosis not present

## 2019-01-23 DIAGNOSIS — R002 Palpitations: Secondary | ICD-10-CM | POA: Diagnosis not present

## 2019-01-23 DIAGNOSIS — Q2112 Patent foramen ovale: Secondary | ICD-10-CM

## 2019-01-23 DIAGNOSIS — I25118 Atherosclerotic heart disease of native coronary artery with other forms of angina pectoris: Secondary | ICD-10-CM | POA: Diagnosis not present

## 2019-01-23 DIAGNOSIS — Q211 Atrial septal defect: Secondary | ICD-10-CM

## 2019-01-23 DIAGNOSIS — I10 Essential (primary) hypertension: Secondary | ICD-10-CM | POA: Diagnosis not present

## 2019-01-23 NOTE — Progress Notes (Signed)
SUBJECTIVE: The patient presents for follow-up of coronary artery disease.  Nuclear stress test on 03/19/2018 demonstrated a large prior inferior/inferoseptal myocardial infarction with no evidence of ischemia.  Duke treadmill score was 9.5 and low risk.  Echocardiogram on 03/19/18 showed normal left ventricular systolic function and regional wall motion, LVEF 55%, mild LVH, grade 1 diastolic dysfunction, and an intra-atrial shunt consistent with PFO or secundum ASD.  Pulmonary pressures were normal.  Event monitoring in April 2019 showed no significant arrhythmias with PVCs.  Coronary angiography on 08/03/2004 demonstrated a 50-60% stenosis in the proximal left circumflex coronary artery.  He also has a history of hypertension, hyperlipidemia, embolic CVA, and PFO. He is intolerant of aspirin and remains on Plavix.  He is doing well overall.  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  He had been lifting some heavy furniture last week and pulled a muscle in his chest which had initially been tender to palpation but this has since resolved.  He denies exertional chest pain and dyspnea.  He currently denies any lightheadedness, dizziness, and syncope.  He did have vertigo 2 weeks ago which resolved with meclizine.  ECG performed in the office today which I ordered and personally interpreted demonstrates normal sinus rhythm with nonspecific T wave abnormalities.  His wife is currently on a [place] gambling cruise.  Social history: His wife, Tomi Bamberger, is also my patient.  Review of Systems: As per "subjective", otherwise negative.  Allergies  Allergen Reactions  . Asa [Aspirin] Other (See Comments)    Caused ulcer in stomach / GI bleeding     Current Outpatient Medications  Medication Sig Dispense Refill  . atorvastatin (LIPITOR) 80 MG tablet Take 1 tablet (80 mg total) by mouth daily. 90 tablet 3  . clopidogrel (PLAVIX) 75 MG tablet Take 1 tablet (75 mg total) by mouth daily. 90 tablet 3    . doxazosin (CARDURA) 8 MG tablet Take 8 mg by mouth at bedtime.    . finasteride (PROSCAR) 5 MG tablet Take 5 mg by mouth daily.      . fish oil-omega-3 fatty acids 1000 MG capsule Take 1 g by mouth daily.     . Flaxseed, Linseed, (FLAXSEED OIL) 1000 MG CAPS Take 1 capsule by mouth daily.    . Melatonin 10 MG TABS Take 1 tablet by mouth at bedtime.    . nitroGLYCERIN (NITROSTAT) 0.4 MG SL tablet Place 1 tablet (0.4 mg total) under the tongue every 5 (five) minutes as needed. 25 tablet 3   No current facility-administered medications for this visit.     Past Medical History:  Diagnosis Date  . BPH (benign prostatic hyperplasia)   . CAD (coronary artery disease)    nonobstructive  . Cholesterol depletion   . CVA (cerebral vascular accident) ([place])   . HTN (hypertension)   . Patent foramen ovale    on plavix, followed by Dr. Jenell Milliner    Past Surgical History:  Procedure Laterality Date  . COLONOSCOPY  2003   hemorrhoids,otherwise normal.   . COLONOSCOPY  09/2010   internal hemorrhoids, next TCS in 09/2015  . ESOPHAGOGASTRODUODENOSCOPY  02/26/11   esophageal erosions consistent with mild relfux/small HH, antral erosions bx mild chronic gastritis and NO h.pylori  . ESOPHAGOGASTRODUODENOSCOPY  09/2010   schatzki ring not manipulated. gastric ulcer with satellite erosions  . GALLBLADDER SURGERY    . INGUINAL HERNIA REPAIR  2006   left    Social History   Socioeconomic History  .  Marital status: Married    Spouse name: Not on file  . Number of children: Not on file  . Years of education: Not on file  . Highest education level: Not on file  Occupational History  . Occupation: retired from [employer]  . Financial resource strain: Not on file  . Food insecurity:    Worry: Not on file    Inability: Not on file  . Transportation needs:    Medical: Not on file    Non-medical: Not on file  Tobacco Use  . Smoking status: Never Smoker  . Smokeless  tobacco: Never Used  Substance and Sexual Activity  . Alcohol use: No  . Drug use: No  . Sexual activity: Not on file  Lifestyle  . Physical activity:    Days per week: Not on file    Minutes per session: Not on file  . Stress: Not on file  Relationships  . Social connections:    Talks on phone: Not on file    Gets together: Not on file    Attends religious service: Not on file    Active member of club or organization: Not on file    Attends meetings of clubs or organizations: Not on file    Relationship status: Not on file  . Intimate partner violence:    Fear of current or ex partner: Not on file    Emotionally abused: Not on file    Physically abused: Not on file    Forced sexual activity: Not on file  Other Topics Concern  . Not on file  Social History Narrative  . Not on file     Vitals:   01/23/19 1033  BP: 112/68  Pulse: 63  SpO2: 95%  Weight: 183 lb (83 kg)  Height: 5\' 10"  (1.778 m)    Wt Readings from Last 3 Encounters:  01/23/19 183 lb (83 kg)  03/31/18 189 lb (85.7 kg)  03/12/18 191 lb 12.8 oz (87 kg)     PHYSICAL EXAM General: NAD HEENT: Normal. Neck: No JVD, no thyromegaly. Lungs: Clear to auscultation bilaterally with normal respiratory effort. CV: Regular rate and rhythm, normal S1/S2, no S3/S4, no murmur. No pretibial or periankle edema.  No carotid bruit.   Abdomen: Soft, nontender, no distention.  Neurologic: Alert and oriented.  Psych: Normal affect. Skin: Normal. Musculoskeletal: No gross deformities.    ECG: Reviewed above under Subjective   Labs: Lab Results  Component Value Date/Time   K 3.7 07/16/2015 10:38 AM   BUN 23 (H) 07/16/2015 10:38 AM   CREATININE 1.34 (H) 07/16/2015 10:38 AM   ALT 35 07/16/2015 10:38 AM   HGB 15.1 07/16/2015 10:38 AM     Lipids: Lab Results  Component Value Date/Time   LDLCALC 92 06/23/2009 11:31 PM   CHOL 162 06/23/2009 11:31 PM   TRIG 113 06/23/2009 11:31 PM   HDL 47 06/23/2009 11:31 PM        ASSESSMENT AND PLAN: 1.  Coronary artery disease: Symptomatically stable.  He is intolerant of aspirin and remains on Plavix.  Continue atorvastatin 80 mg.  Nuclear stress test and cardiac catheterization results reviewed above.  2.  Palpitations: Event monitoring in April 2019 demonstrated no significant arrhythmias.  He had symptomatic PVCs.  Denies palpitations at present.  3.  Hyperlipidemia: Continue atorvastatin 80 mg.  I will obtain a copy of lipids from PCP.  4. History of PFO and CVA: No recurrences. Continue Plavix and atorvastatin.  Echocardiogram  confirmed evidence of a PFO as detailed above.  5.  Hypertension: Controlled on Cardura.  No changes.   Disposition: Follow up 6 months   [name], M.D., F.A.C.C.

## 2019-01-23 NOTE — Patient Instructions (Signed)
Medication Instructions:  Your physician recommends that you continue on your current medications as directed. Please refer to the Current Medication list given to you today.  If you need a refill on your cardiac medications before your next appointment, please call your pharmacy.   Lab work: None today If you have labs (blood work) drawn today and your tests are completely normal, you will receive your results only by: . MyChart Message (if you have MyChart) OR . A paper copy in the mail If you have any lab test that is abnormal or we need to change your treatment, we will call you to review the results.  Testing/Procedures: None  Follow-Up: At [facility], you and your health needs are our priority.  As part of our continuing mission to provide you with exceptional heart care, we have created designated Provider Care Teams.  These Care Teams include your primary Cardiologist (physician) and Advanced Practice Providers (APPs -  Physician Assistants and Nurse Practitioners) who all work together to provide you with the care you need, when you need it. You will need a follow up appointment in 6 months.  Please call our office 2 months in advance to schedule this appointment.  You may see Suresh Koneswaran, MD or one of the following Advanced Practice Providers on your designated Care Team:   Brittany Strader, PA-C ([place] Office) . Michele Lenze, PA-C ([place] Office)  Any Other Special Instructions Will Be Listed Below (If Applicable). None   

## 2019-02-19 DIAGNOSIS — K219 Gastro-esophageal reflux disease without esophagitis: Secondary | ICD-10-CM | POA: Diagnosis not present

## 2019-02-19 DIAGNOSIS — I2510 Atherosclerotic heart disease of native coronary artery without angina pectoris: Secondary | ICD-10-CM | POA: Diagnosis not present

## 2019-02-19 DIAGNOSIS — E782 Mixed hyperlipidemia: Secondary | ICD-10-CM | POA: Diagnosis not present

## 2019-02-19 DIAGNOSIS — R7301 Impaired fasting glucose: Secondary | ICD-10-CM | POA: Diagnosis not present

## 2019-02-22 IMAGING — NM NM MYOCAR MULTI W/SPECT W/WALL MOTION & EF
2 series · 12 of 12 positions shown · non-contrast
Comparison: none

[Series 1: rest · 6.51mm/px · 6 of 64 frames shown]
[frame 6/64]
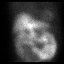
[frame 16/64]
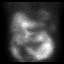
[frame 27/64]
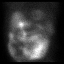
[frame 38/64]
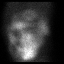
[frame 48/64]
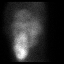
[frame 59/64]
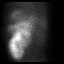

[Series 3: stress gated - perfusion · 6.51mm/px · 6 of 64 frames shown]
[frame 6/64]
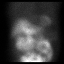
[frame 16/64]
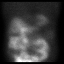
[frame 27/64]
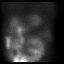
[frame 38/64]
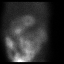
[frame 48/64]
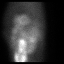
[frame 59/64]
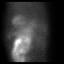

[12 of 12 positions shown; findings below may reference images not displayed]

Canned report from images found in remote index.

Refer to host system for actual result text.

## 2019-03-20 DIAGNOSIS — I2510 Atherosclerotic heart disease of native coronary artery without angina pectoris: Secondary | ICD-10-CM | POA: Diagnosis not present

## 2019-03-20 DIAGNOSIS — K219 Gastro-esophageal reflux disease without esophagitis: Secondary | ICD-10-CM | POA: Diagnosis not present

## 2019-03-20 DIAGNOSIS — E782 Mixed hyperlipidemia: Secondary | ICD-10-CM | POA: Diagnosis not present

## 2019-03-20 DIAGNOSIS — R7301 Impaired fasting glucose: Secondary | ICD-10-CM | POA: Diagnosis not present

## 2019-04-02 DIAGNOSIS — Z0000 Encounter for general adult medical examination without abnormal findings: Secondary | ICD-10-CM | POA: Diagnosis not present

## 2019-04-20 DIAGNOSIS — L300 Nummular dermatitis: Secondary | ICD-10-CM | POA: Diagnosis not present

## 2019-05-14 ENCOUNTER — Telehealth: Payer: Self-pay | Admitting: Internal Medicine

## 2019-05-14 NOTE — Telephone Encounter (Signed)
Pt's wife was calling to follow up when the patient's next colonoscopy was due. He wasn't on the recall and I believe the last one was done in 2012, but I couldn't find the procedure report with recommendations. Please advise when [name] [name] is due again. [phone]

## 2019-05-18 NOTE — Telephone Encounter (Signed)
LSL, I spouse with pts spouse. Pt isn't having any problems but would like to know when his next TCS is suppose to be. I did tell them that he was seen 5 years ago and was asked to f/u in 2 years. Please advise.

## 2019-05-19 NOTE — Telephone Encounter (Signed)
Last TCS was 09/2010: internal hemorrhoids. Per note, due to [place] was supposed to have another TCS 09/2015.

## 2019-05-19 NOTE — Telephone Encounter (Signed)
Spoke with spouse. They are aware that pt will need an appointment to schedule TCS. Pt is going to call back to schedule apt.

## 2019-05-26 DIAGNOSIS — R5383 Other fatigue: Secondary | ICD-10-CM | POA: Diagnosis not present

## 2019-05-26 DIAGNOSIS — R42 Dizziness and giddiness: Secondary | ICD-10-CM | POA: Diagnosis not present

## 2019-05-26 DIAGNOSIS — G4489 Other headache syndrome: Secondary | ICD-10-CM | POA: Diagnosis not present

## 2019-05-27 DIAGNOSIS — E782 Mixed hyperlipidemia: Secondary | ICD-10-CM | POA: Diagnosis not present

## 2019-05-27 DIAGNOSIS — I2510 Atherosclerotic heart disease of native coronary artery without angina pectoris: Secondary | ICD-10-CM | POA: Diagnosis not present

## 2019-05-27 DIAGNOSIS — K219 Gastro-esophageal reflux disease without esophagitis: Secondary | ICD-10-CM | POA: Diagnosis not present

## 2019-05-27 DIAGNOSIS — R7301 Impaired fasting glucose: Secondary | ICD-10-CM | POA: Diagnosis not present

## 2019-07-13 DIAGNOSIS — G459 Transient cerebral ischemic attack, unspecified: Secondary | ICD-10-CM | POA: Diagnosis not present

## 2019-07-13 DIAGNOSIS — R944 Abnormal results of kidney function studies: Secondary | ICD-10-CM | POA: Diagnosis not present

## 2019-07-13 DIAGNOSIS — I2510 Atherosclerotic heart disease of native coronary artery without angina pectoris: Secondary | ICD-10-CM | POA: Diagnosis not present

## 2019-07-13 DIAGNOSIS — I10 Essential (primary) hypertension: Secondary | ICD-10-CM | POA: Diagnosis not present

## 2019-07-13 DIAGNOSIS — E782 Mixed hyperlipidemia: Secondary | ICD-10-CM | POA: Diagnosis not present

## 2019-07-13 DIAGNOSIS — N401 Enlarged prostate with lower urinary tract symptoms: Secondary | ICD-10-CM | POA: Diagnosis not present

## 2019-07-13 DIAGNOSIS — K219 Gastro-esophageal reflux disease without esophagitis: Secondary | ICD-10-CM | POA: Diagnosis not present

## 2019-07-13 DIAGNOSIS — R7301 Impaired fasting glucose: Secondary | ICD-10-CM | POA: Diagnosis not present

## 2019-07-14 ENCOUNTER — Other Ambulatory Visit: Payer: Self-pay

## 2019-07-14 ENCOUNTER — Encounter: Payer: Self-pay | Admitting: *Deleted

## 2019-07-14 ENCOUNTER — Encounter: Payer: Self-pay | Admitting: Gastroenterology

## 2019-07-14 ENCOUNTER — Ambulatory Visit (INDEPENDENT_AMBULATORY_CARE_PROVIDER_SITE_OTHER): Payer: PPO | Admitting: Gastroenterology

## 2019-07-14 ENCOUNTER — Other Ambulatory Visit: Payer: Self-pay | Admitting: *Deleted

## 2019-07-14 VITALS — BP 122/70 | HR 75 | Temp 97.1°F | Ht 70.0 in | Wt 186.2 lb

## 2019-07-14 DIAGNOSIS — Z8371 Family history of colonic polyps: Secondary | ICD-10-CM

## 2019-07-14 DIAGNOSIS — Z86010 Personal history of colonic polyps: Secondary | ICD-10-CM | POA: Diagnosis not present

## 2019-07-14 MED ORDER — PEG 3350-KCL-NA BICARB-NACL 420 G PO SOLR: 4000.0000 mL | Freq: Once | ORAL | 0 refills | Status: AC

## 2019-07-14 NOTE — Progress Notes (Addendum)
Primary Care Physician:  Celene Squibb, MD  Primary Gastroenterologist:  Garfield Cornea, MD   Chief Complaint  Patient presents with  . Colonoscopy    consult    HPI:  [name] [name] is a 73 y.o. male here to schedule high risk screening colonoscopy. Patient's mother had h/o multiple colon polyps.  She died in her late 70s due to AAA.  Brother had a long history of colon issues, kink in his colon requiring colostomy.  Previously had reported brother had history of colon cancer at an early age, however patient is not clear on this at this time.  Patient's last colonoscopy was in 2011, had internal hemorrhoids.  He was due to come back in 2016.  He denies any constipation, diarrhea, melena, rectal bleeding.  No abdominal pain.  No upper GI symptoms.  Current Outpatient Medications  Medication Sig Dispense Refill  . atorvastatin (LIPITOR) 20 MG tablet Take 20 mg by mouth daily.    . clopidogrel (PLAVIX) 75 MG tablet Take 1 tablet (75 mg total) by mouth daily. 90 tablet 3  . doxazosin (CARDURA) 8 MG tablet Take 8 mg by mouth at bedtime.    . finasteride (PROSCAR) 5 MG tablet Take 5 mg by mouth daily.      . fish oil-omega-3 fatty acids 1000 MG capsule Take 1 g by mouth daily.     . Flaxseed, Linseed, (FLAXSEED OIL) 1000 MG CAPS Take 1 capsule by mouth daily.    . Magnesium 500 MG TABS Take 1 tablet by mouth daily.    . Melatonin 10 MG TABS Take 1 tablet by mouth at bedtime.    . nitroGLYCERIN (NITROSTAT) 0.4 MG SL tablet Place 1 tablet (0.4 mg total) under the tongue every 5 (five) minutes as needed. 25 tablet 3   No current facility-administered medications for this visit.     Allergies as of 07/14/2019 - Review Complete 07/14/2019  Allergen Reaction Noted  . Asa [aspirin] Other (See Comments) 10/08/2014    Past Medical History:  Diagnosis Date  . BPH (benign prostatic hyperplasia)   . CAD (coronary artery disease)    nonobstructive  . Cholesterol depletion   . CVA (cerebral  vascular accident) ([place])   . HTN (hypertension)   . Patent foramen ovale    on plavix, followed by Dr. Jenell Milliner    Past Surgical History:  Procedure Laterality Date  . COLONOSCOPY  2003   hemorrhoids,otherwise normal.   . COLONOSCOPY  09/2010   internal hemorrhoids, next TCS in 09/2015  . ESOPHAGOGASTRODUODENOSCOPY  02/26/11   esophageal erosions consistent with mild relfux/small HH, antral erosions bx mild chronic gastritis and NO h.pylori  . ESOPHAGOGASTRODUODENOSCOPY  09/2010   schatzki ring not manipulated. gastric ulcer with satellite erosions  . GALLBLADDER SURGERY    . INGUINAL HERNIA REPAIR  2006   left    Family History  Problem Relation Age of Onset  . Other Brother        patient had colostomy at young age with long history of colon issues. previously reported possible colon cancer in his 15s but patient not sure about this  . Colon polyps Mother        advanced age, died secondary to AAA rupture  . COPD Father     Social History   Socioeconomic History  . Marital status: Married    Spouse name: Not on file  . Number of children: Not on file  . Years of education: Not on file  .  Highest education level: Not on file  Occupational History  . Occupation: retired from [employer]  . Financial resource strain: Not on file  . Food insecurity    Worry: Not on file    Inability: Not on file  . Transportation needs    Medical: Not on file    Non-medical: Not on file  Tobacco Use  . Smoking status: Never Smoker  . Smokeless tobacco: Never Used  Substance and Sexual Activity  . Alcohol use: No  . Drug use: No  . Sexual activity: Not on file  Lifestyle  . Physical activity    Days per week: Not on file    Minutes per session: Not on file  . Stress: Not on file  Relationships  . Social Herbalist on phone: Not on file    Gets together: Not on file    Attends religious service: Not on file    Active member of club or  organization: Not on file    Attends meetings of clubs or organizations: Not on file    Relationship status: Not on file  . Intimate partner violence    Fear of current or ex partner: Not on file    Emotionally abused: Not on file    Physically abused: Not on file    Forced sexual activity: Not on file  Other Topics Concern  . Not on file  Social History Narrative  . Not on file      ROS:  General: Negative for anorexia, weight loss, fever, chills, fatigue, weakness. Eyes: Negative for vision changes.  ENT: Negative for hoarseness, difficulty swallowing , nasal congestion. CV: Negative for chest pain, angina, palpitations, dyspnea on exertion, peripheral edema.  Respiratory: Negative for dyspnea at rest, dyspnea on exertion, cough, sputum, wheezing.  GI: See history of present illness. GU:  Negative for dysuria, hematuria, urinary incontinence, urinary frequency, nocturnal urination.  MS: Negative for joint pain, low back pain.  Derm: Negative for rash or itching.  Neuro: Negative for weakness, abnormal sensation, seizure, frequent headaches, memory loss, confusion.  Psych: Negative for anxiety, depression, suicidal ideation, hallucinations.  Endo: Negative for unusual weight change.  Heme: Negative for bruising or bleeding. Allergy: Negative for rash or hives.    Physical Examination:  BP 122/70   Pulse 75   Temp (!) 97.1 F (36.2 C) (Oral)   Ht 5\' 10"  (1.778 m)   Wt 186 lb 3.2 oz (84.5 kg)   BMI 26.72 kg/m    General: Well-nourished, well-developed in no acute distress.  Head: Normocephalic, atraumatic.   Eyes: Conjunctiva pink, no icterus. Mouth: Oropharyngeal mucosa moist and pink , no lesions erythema or exudate. Neck: Supple without thyromegaly, masses, or lymphadenopathy.  Lungs: Clear to auscultation bilaterally.  Heart: Regular rate and rhythm, no murmurs rubs or gallops.  Abdomen: Bowel sounds are normal, nontender, nondistended, no hepatosplenomegaly or  masses, no abdominal bruits or    hernia , no rebound or guarding.   Rectal: not performed Extremities: No lower extremity edema. No clubbing or deformities.  Neuro: Alert and oriented x 4 , grossly normal neurologically.  Skin: Warm and dry, no rash or jaundice.   Psych: Alert and cooperative, normal mood and affect.

## 2019-07-14 NOTE — Patient Instructions (Signed)
1. Colonoscopy as scheduled. See separate instructions.  

## 2019-07-14 NOTE — Assessment & Plan Note (Signed)
Pleasant 73 y/o male with FH of colon polyps (mother), ?h/o colon cancer brother at age less than 66 (long history of GI issues with colostomy and patient not sure if he actually had colon cancer) who presents for high risk screening colonoscopy. Patient denies any GI concerns.   Plan for colonoscopy in near future.  I have discussed the risks, alternatives, benefits with regards to but not limited to the risk of reaction to medication, bleeding, infection, perforation and the patient is agreeable to proceed. Written consent to be obtained.

## 2019-07-14 NOTE — H&P (View-Only) (Signed)
Primary Care Physician:  Celene Squibb, MD  Primary Gastroenterologist:  Garfield Cornea, MD   Chief Complaint  Patient presents with  . Colonoscopy    consult    HPI:  [name] [name] is a 73 y.o. male here to schedule high risk screening colonoscopy. Patient's mother had h/o multiple colon polyps.  She died in her late 83s due to AAA.  Brother had a long history of colon issues, kink in his colon requiring colostomy.  Previously had reported brother had history of colon cancer at an early age, however patient is not clear on this at this time.  Patient's last colonoscopy was in 2011, had internal hemorrhoids.  He was due to come back in 2016.  He denies any constipation, diarrhea, melena, rectal bleeding.  No abdominal pain.  No upper GI symptoms.  Current Outpatient Medications  Medication Sig Dispense Refill  . atorvastatin (LIPITOR) 20 MG tablet Take 20 mg by mouth daily.    . clopidogrel (PLAVIX) 75 MG tablet Take 1 tablet (75 mg total) by mouth daily. 90 tablet 3  . doxazosin (CARDURA) 8 MG tablet Take 8 mg by mouth at bedtime.    . finasteride (PROSCAR) 5 MG tablet Take 5 mg by mouth daily.      . fish oil-omega-3 fatty acids 1000 MG capsule Take 1 g by mouth daily.     . Flaxseed, Linseed, (FLAXSEED OIL) 1000 MG CAPS Take 1 capsule by mouth daily.    . Magnesium 500 MG TABS Take 1 tablet by mouth daily.    . Melatonin 10 MG TABS Take 1 tablet by mouth at bedtime.    . nitroGLYCERIN (NITROSTAT) 0.4 MG SL tablet Place 1 tablet (0.4 mg total) under the tongue every 5 (five) minutes as needed. 25 tablet 3   No current facility-administered medications for this visit.     Allergies as of 07/14/2019 - Review Complete 07/14/2019  Allergen Reaction Noted  . Asa [aspirin] Other (See Comments) 10/08/2014    Past Medical History:  Diagnosis Date  . BPH (benign prostatic hyperplasia)   . CAD (coronary artery disease)    nonobstructive  . Cholesterol depletion   . CVA (cerebral  vascular accident) ([place])   . HTN (hypertension)   . Patent foramen ovale    on plavix, followed by Dr. Jenell Milliner    Past Surgical History:  Procedure Laterality Date  . COLONOSCOPY  2003   hemorrhoids,otherwise normal.   . COLONOSCOPY  09/2010   internal hemorrhoids, next TCS in 09/2015  . ESOPHAGOGASTRODUODENOSCOPY  02/26/11   esophageal erosions consistent with mild relfux/small HH, antral erosions bx mild chronic gastritis and NO h.pylori  . ESOPHAGOGASTRODUODENOSCOPY  09/2010   schatzki ring not manipulated. gastric ulcer with satellite erosions  . GALLBLADDER SURGERY    . INGUINAL HERNIA REPAIR  2006   left    Family History  Problem Relation Age of Onset  . Other Brother        patient had colostomy at young age with long history of colon issues. previously reported possible colon cancer in his 56s but patient not sure about this  . Colon polyps Mother        advanced age, died secondary to AAA rupture  . COPD Father     Social History   Socioeconomic History  . Marital status: Married    Spouse name: Not on file  . Number of children: Not on file  . Years of education: Not on file  .  Highest education level: Not on file  Occupational History  . Occupation: retired from [employer]  . Financial resource strain: Not on file  . Food insecurity    Worry: Not on file    Inability: Not on file  . Transportation needs    Medical: Not on file    Non-medical: Not on file  Tobacco Use  . Smoking status: Never Smoker  . Smokeless tobacco: Never Used  Substance and Sexual Activity  . Alcohol use: No  . Drug use: No  . Sexual activity: Not on file  Lifestyle  . Physical activity    Days per week: Not on file    Minutes per session: Not on file  . Stress: Not on file  Relationships  . Social Herbalist on phone: Not on file    Gets together: Not on file    Attends religious service: Not on file    Active member of club or  organization: Not on file    Attends meetings of clubs or organizations: Not on file    Relationship status: Not on file  . Intimate partner violence    Fear of current or ex partner: Not on file    Emotionally abused: Not on file    Physically abused: Not on file    Forced sexual activity: Not on file  Other Topics Concern  . Not on file  Social History Narrative  . Not on file      ROS:  General: Negative for anorexia, weight loss, fever, chills, fatigue, weakness. Eyes: Negative for vision changes.  ENT: Negative for hoarseness, difficulty swallowing , nasal congestion. CV: Negative for chest pain, angina, palpitations, dyspnea on exertion, peripheral edema.  Respiratory: Negative for dyspnea at rest, dyspnea on exertion, cough, sputum, wheezing.  GI: See history of present illness. GU:  Negative for dysuria, hematuria, urinary incontinence, urinary frequency, nocturnal urination.  MS: Negative for joint pain, low back pain.  Derm: Negative for rash or itching.  Neuro: Negative for weakness, abnormal sensation, seizure, frequent headaches, memory loss, confusion.  Psych: Negative for anxiety, depression, suicidal ideation, hallucinations.  Endo: Negative for unusual weight change.  Heme: Negative for bruising or bleeding. Allergy: Negative for rash or hives.    Physical Examination:  BP 122/70   Pulse 75   Temp (!) 97.1 F (36.2 C) (Oral)   Ht 5\' 10"  (1.778 m)   Wt 186 lb 3.2 oz (84.5 kg)   BMI 26.72 kg/m    General: Well-nourished, well-developed in no acute distress.  Head: Normocephalic, atraumatic.   Eyes: Conjunctiva pink, no icterus. Mouth: Oropharyngeal mucosa moist and pink , no lesions erythema or exudate. Neck: Supple without thyromegaly, masses, or lymphadenopathy.  Lungs: Clear to auscultation bilaterally.  Heart: Regular rate and rhythm, no murmurs rubs or gallops.  Abdomen: Bowel sounds are normal, nontender, nondistended, no hepatosplenomegaly or  masses, no abdominal bruits or    hernia , no rebound or guarding.   Rectal: not performed Extremities: No lower extremity edema. No clubbing or deformities.  Neuro: Alert and oriented x 4 , grossly normal neurologically.  Skin: Warm and dry, no rash or jaundice.   Psych: Alert and cooperative, normal mood and affect.

## 2019-07-16 NOTE — Progress Notes (Signed)
cc'd to pcp 

## 2019-07-20 ENCOUNTER — Telehealth: Payer: Self-pay | Admitting: *Deleted

## 2019-07-20 ENCOUNTER — Other Ambulatory Visit (HOSPITAL_COMMUNITY)
Admission: RE | Admit: 2019-07-20 | Discharge: 2019-07-20 | Disposition: A | Discharge status: Home / Self Care | Payer: PPO | Source: Ambulatory Visit | Attending: Internal Medicine | Admitting: Internal Medicine

## 2019-07-20 DIAGNOSIS — Z20828 Contact with and (suspected) exposure to other viral communicable diseases: Secondary | ICD-10-CM | POA: Insufficient documentation

## 2019-07-20 DIAGNOSIS — Z01812 Encounter for preprocedural laboratory examination: Secondary | ICD-10-CM | POA: Diagnosis not present

## 2019-07-20 LAB — SARS CORONAVIRUS 2 (TAT 6-24 HRS): SARS Coronavirus 2: NEGATIVE

## 2019-07-20 NOTE — Telephone Encounter (Signed)
LMOVM to see if patient can move his procedure time up for procedure tomorrow

## 2019-07-20 NOTE — Telephone Encounter (Signed)
Received call patient did not show up for COVID-19 testing.  I called patient and he states he did show up and was there at 8:20am and he was tested.  I called endo and spoke with [facility]. I made her aware. She will inform cleo who advised her of this.  Also patient agree'd to move procedure time up to 12:00pm 8/4. Patient aware to arrive at 11:00am at [facility] short stay. He will drink 2nd half of prep at 7:00am, npo 9:00am. Called endo and made Kim aware

## 2019-07-21 ENCOUNTER — Other Ambulatory Visit: Payer: Self-pay

## 2019-07-21 ENCOUNTER — Encounter (HOSPITAL_COMMUNITY): Payer: Self-pay | Admitting: *Deleted

## 2019-07-21 ENCOUNTER — Ambulatory Visit (HOSPITAL_COMMUNITY)
Admission: RE | Admit: 2019-07-21 | Discharge: 2019-07-21 | Disposition: A | Discharge status: Home / Self Care | Payer: PPO | Attending: Internal Medicine | Admitting: Internal Medicine

## 2019-07-21 ENCOUNTER — Encounter (HOSPITAL_COMMUNITY)
Admission: RE | Disposition: A | Discharge status: Home / Self Care | Payer: Self-pay | Source: Home / Self Care | Attending: Internal Medicine

## 2019-07-21 DIAGNOSIS — Z86010 Personal history of colonic polyps: Secondary | ICD-10-CM

## 2019-07-21 DIAGNOSIS — Z8673 Personal history of transient ischemic attack (TIA), and cerebral infarction without residual deficits: Secondary | ICD-10-CM | POA: Diagnosis not present

## 2019-07-21 DIAGNOSIS — I2510 Atherosclerotic heart disease of native coronary artery without angina pectoris: Secondary | ICD-10-CM | POA: Insufficient documentation

## 2019-07-21 DIAGNOSIS — Q211 Atrial septal defect: Secondary | ICD-10-CM | POA: Insufficient documentation

## 2019-07-21 DIAGNOSIS — Z1211 Encounter for screening for malignant neoplasm of colon: Secondary | ICD-10-CM | POA: Diagnosis not present

## 2019-07-21 DIAGNOSIS — Z8371 Family history of colonic polyps: Secondary | ICD-10-CM

## 2019-07-21 DIAGNOSIS — I10 Essential (primary) hypertension: Secondary | ICD-10-CM | POA: Insufficient documentation

## 2019-07-21 DIAGNOSIS — N400 Enlarged prostate without lower urinary tract symptoms: Secondary | ICD-10-CM | POA: Diagnosis not present

## 2019-07-21 HISTORY — PX: COLONOSCOPY: SHX5424

## 2019-07-21 SURGERY — COLONOSCOPY
Anesthesia: Moderate Sedation

## 2019-07-21 MED ORDER — SODIUM CHLORIDE 0.9 % IV SOLN
INTRAVENOUS | Status: DC
  Administered 2019-07-21: 11:00:00 via INTRAVENOUS

## 2019-07-21 MED ORDER — ONDANSETRON HCL 4 MG/2ML IJ SOLN
INTRAMUSCULAR | Status: DC | PRN
  Administered 2019-07-21: 4 mg via INTRAVENOUS

## 2019-07-21 MED ORDER — MEPERIDINE HCL 100 MG/ML IJ SOLN
INTRAMUSCULAR | Status: DC | PRN
  Administered 2019-07-21: 25 mg via INTRAVENOUS
  Administered 2019-07-21: 10 mg via INTRAVENOUS

## 2019-07-21 MED ORDER — ONDANSETRON HCL 4 MG/2ML IJ SOLN
INTRAMUSCULAR | Status: AC
  Filled 2019-07-21: qty 2

## 2019-07-21 MED ORDER — STERILE WATER FOR IRRIGATION IR SOLN
Status: DC | PRN
  Administered 2019-07-21: 11:00:00 1.5 mL

## 2019-07-21 MED ORDER — MIDAZOLAM HCL 5 MG/5ML IJ SOLN
INTRAMUSCULAR | Status: AC
  Filled 2019-07-21: qty 10

## 2019-07-21 MED ORDER — MEPERIDINE HCL 50 MG/ML IJ SOLN
INTRAMUSCULAR | Status: AC
  Filled 2019-07-21: qty 1

## 2019-07-21 MED ORDER — MIDAZOLAM HCL 5 MG/5ML IJ SOLN
INTRAMUSCULAR | Status: DC | PRN
  Administered 2019-07-21: 1 mg via INTRAVENOUS
  Administered 2019-07-21: 2 mg via INTRAVENOUS
  Administered 2019-07-21: 1 mg via INTRAVENOUS

## 2019-07-21 NOTE — Discharge Instructions (Signed)
°  Colonoscopy Discharge Instructions  Read the instructions outlined below and refer to this sheet in the next few weeks. These discharge instructions provide you with general information on caring for yourself after you leave the hospital. Your doctor may also give you specific instructions. While your treatment has been planned according to the most current medical practices available, unavoidable complications occasionally occur. If you have any problems or questions after discharge, call Dr. Gala Romney at [phone]. ACTIVITY  You may resume your regular activity, but move at a slower pace for the next 24 hours.   Take frequent rest periods for the next 24 hours.   Walking will help get rid of the air and reduce the bloated feeling in your belly (abdomen).   No driving for 24 hours (because of the medicine (anesthesia) used during the test).    Do not sign any important legal documents or operate any machinery for 24 hours (because of the anesthesia used during the test).  NUTRITION  Drink plenty of fluids.   You may resume your normal diet as instructed by your doctor.   Begin with a light meal and progress to your normal diet. Heavy or fried foods are harder to digest and may make you feel sick to your stomach (nauseated).   Avoid alcoholic beverages for 24 hours or as instructed.  MEDICATIONS  You may resume your normal medications unless your doctor tells you otherwise.  WHAT YOU CAN EXPECT TODAY  Some feelings of bloating in the abdomen.   Passage of more gas than usual.   Spotting of blood in your stool or on the toilet paper.  IF YOU HAD POLYPS REMOVED DURING THE COLONOSCOPY:  No aspirin products for 7 days or as instructed.   No alcohol for 7 days or as instructed.   Eat a soft diet for the next 24 hours.  FINDING OUT THE RESULTS OF YOUR TEST Not all test results are available during your visit. If your test results are not back during the visit, make an appointment  with your caregiver to find out the results. Do not assume everything is normal if you have not heard from your caregiver or the medical facility. It is important for you to follow up on all of your test results.  SEEK IMMEDIATE MEDICAL ATTENTION IF:  You have more than a spotting of blood in your stool.   Your belly is swollen (abdominal distention).   You are nauseated or vomiting.   You have a temperature over 101.   You have abdominal pain or discomfort that is severe or gets worse throughout the day.    I do not recommend a future colonoscopy unless new symptoms develop  I discussed my findings and recommendations with [name] [name] at [phone]

## 2019-07-21 NOTE — Op Note (Signed)
[facility] Patient Name: [name] [name] Procedure Date: 07/21/2019 10:39 AM MRN: [id number] Date of Birth: May 04, 1946 Attending MD: Norvel Richards , MD CSN: [id number] Age: 73 Admit Type: Outpatient Procedure:                Colonoscopy Indications:              Screening for colorectal malignant neoplasm Providers:                Norvel Richards, MD, Charlsie Quest. Theda Sers RN, RN,                            Nelma Rothman, Technician Referring MD:              Medicines:                Midazolam 4 mg IV, Meperidine 35 mg IV, Ondansetron                            4 mg IV Complications:            No immediate complications. Estimated Blood Loss:     Estimated blood loss: none. Procedure:                Pre-Anesthesia Assessment:                           - Prior to the procedure, a History and Physical                            was performed, and patient medications and                            allergies were reviewed. The patient's tolerance of                            previous anesthesia was also reviewed. The risks                            and benefits of the procedure and the sedation                            options and risks were discussed with the patient.                            All questions were answered, and informed consent                            was obtained. ASA Grade Assessment: II - A patient                            with mild systemic disease. After reviewing the                            risks and benefits, the patient was deemed in  satisfactory condition to undergo the procedure.                           After obtaining informed consent, the colonoscope                            was passed under direct vision. Throughout the                            procedure, the patient's blood pressure, pulse, and                            oxygen saturations were monitored continuously. The   CF-HQ190L ([id number]) scope was introduced through                            the anus and advanced to the the cecum, identified                            by appendiceal orifice and ileocecal valve. The                            colonoscopy was performed without difficulty. The                            patient tolerated the procedure well. The quality                            of the bowel preparation was adequate. Scope In: 10:45:53 AM Scope Out: 11:00:54 AM Scope Withdrawal Time: 0 hours 7 minutes 56 seconds  Total Procedure Duration: 0 hours 15 minutes 1 second  Findings:      The perianal and digital rectal examinations were normal.      The exam was otherwise without abnormality on direct and retroflexion       views. Impression:               - The entire examined colon is normal.                           - The examination was otherwise normal on direct                            and retroflexion views.                           - No specimens collected. Moderate Sedation:      Moderate (conscious) sedation was administered by the endoscopy nurse       and supervised by the endoscopist. The following parameters were       monitored: oxygen saturation, heart rate, blood pressure, respiratory       rate, EKG, adequacy of pulmonary ventilation, and response to care.       Total physician intraservice time was 20 minutes. Recommendation:           - Patient has a contact number available for  emergencies. The signs and symptoms of potential                            delayed complications were discussed with the                            patient. Return to normal activities tomorrow.                            Written discharge instructions were provided to the                            patient.                           - Advance diet as tolerated.                           - Continue present medications.                           - No repeat  colonoscopy due to current age (29                            years or older).                           - Return to GI clinic PRN. Procedure Code(s):        --- Professional ---                           [id number], Colonoscopy, flexible; diagnostic, including                            collection of specimen(s) by brushing or washing,                            when performed (separate procedure)                           G0500, Moderate sedation services provided by the                            same physician or other qualified health care                            professional performing a gastrointestinal                            endoscopic service that sedation supports,                            requiring the presence of an independent trained                            observer to assist in the monitoring of  the                            patient's level of consciousness and physiological                            status; initial 15 minutes of intra-service time;                            patient age 61 years or older (additional time may                            be reported with [phone], as appropriate) Diagnosis Code(s):        --- Professional ---                           Z12.11, Encounter for screening for malignant                            neoplasm of colon CPT copyright 2019 American Medical Association. All rights reserved. The codes documented in this report are preliminary and upon coder review may  be revised to meet current compliance requirements. Cristopher Estimable. Reiley Bertagnolli, MD Norvel Richards, MD 07/21/2019 11:09:08 AM This report has been signed electronically. Number of Addenda: 0

## 2019-07-21 NOTE — Interval H&P Note (Signed)
History and Physical Interval Note:  07/21/2019 10:35 AM  [name] [name]  has presented today for surgery, with the diagnosis of FH colon polyps, h/o colon polyps.  The various methods of treatment have been discussed with the patient and family. After consideration of risks, benefits and other options for treatment, the patient has consented to  Procedure(s) with comments: COLONOSCOPY (N/A) - 1:15pm-moved up to 12:00 per Mindy as a surgical intervention.  The patient's history has been reviewed, patient examined, no change in status, stable for surgery.  I have reviewed the patient's chart and labs.  Questions were answered to the patient's satisfaction.     [name] [name]  No change.  High risk screening colonoscopy per plan.  The risks, benefits, limitations, alternatives and imponderables have been reviewed with the patient. Questions have been answered. All parties are agreeable.

## 2019-07-27 DIAGNOSIS — K219 Gastro-esophageal reflux disease without esophagitis: Secondary | ICD-10-CM | POA: Diagnosis not present

## 2019-07-27 DIAGNOSIS — G459 Transient cerebral ischemic attack, unspecified: Secondary | ICD-10-CM | POA: Diagnosis not present

## 2019-07-27 DIAGNOSIS — I10 Essential (primary) hypertension: Secondary | ICD-10-CM | POA: Diagnosis not present

## 2019-07-27 DIAGNOSIS — N401 Enlarged prostate with lower urinary tract symptoms: Secondary | ICD-10-CM | POA: Diagnosis not present

## 2019-07-27 DIAGNOSIS — R944 Abnormal results of kidney function studies: Secondary | ICD-10-CM | POA: Diagnosis not present

## 2019-07-27 DIAGNOSIS — E782 Mixed hyperlipidemia: Secondary | ICD-10-CM | POA: Diagnosis not present

## 2019-07-27 DIAGNOSIS — R7301 Impaired fasting glucose: Secondary | ICD-10-CM | POA: Diagnosis not present

## 2019-07-27 DIAGNOSIS — I2510 Atherosclerotic heart disease of native coronary artery without angina pectoris: Secondary | ICD-10-CM | POA: Diagnosis not present

## 2019-08-03 ENCOUNTER — Encounter (HOSPITAL_COMMUNITY): Payer: Self-pay | Admitting: Internal Medicine

## 2019-08-20 ENCOUNTER — Other Ambulatory Visit: Payer: Self-pay

## 2019-08-20 ENCOUNTER — Encounter: Payer: Self-pay | Admitting: Cardiovascular Disease

## 2019-08-20 ENCOUNTER — Ambulatory Visit: Payer: PPO | Admitting: Cardiovascular Disease

## 2019-08-20 VITALS — BP 130/80 | HR 70 | Temp 97.9°F | Ht 70.0 in | Wt 187.0 lb

## 2019-08-20 DIAGNOSIS — I25118 Atherosclerotic heart disease of native coronary artery with other forms of angina pectoris: Secondary | ICD-10-CM | POA: Diagnosis not present

## 2019-08-20 DIAGNOSIS — Q211 Atrial septal defect: Secondary | ICD-10-CM

## 2019-08-20 DIAGNOSIS — E785 Hyperlipidemia, unspecified: Secondary | ICD-10-CM | POA: Diagnosis not present

## 2019-08-20 DIAGNOSIS — R002 Palpitations: Secondary | ICD-10-CM

## 2019-08-20 DIAGNOSIS — Z8673 Personal history of transient ischemic attack (TIA), and cerebral infarction without residual deficits: Secondary | ICD-10-CM

## 2019-08-20 DIAGNOSIS — I10 Essential (primary) hypertension: Secondary | ICD-10-CM | POA: Diagnosis not present

## 2019-08-20 DIAGNOSIS — Q2112 Patent foramen ovale: Secondary | ICD-10-CM

## 2019-08-20 NOTE — Patient Instructions (Signed)
Medication Instructions: Your physician recommends that you continue on your current medications as directed. Please refer to the Current Medication list given to you today.   Labwork: PSA,CMET lipids (FASTING)  Procedures/Testing: None today  Follow-Up: 1 year with Dr.Koneswaran  Any Additional Special Instructions Will Be Listed Below (If Applicable).     If you need a refill on your cardiac medications before your next appointment, please call your pharmacy.     Thank you for choosing [facility] !

## 2019-08-20 NOTE — Progress Notes (Signed)
SUBJECTIVE: The patient presents for follow-up of coronary artery disease.  Nuclear stress test on 03/19/2018 demonstrated a large prior inferior/inferoseptal myocardial infarction with no evidence of ischemia. Duke treadmill score was 9.5 and low risk.  Echocardiogram on 03/19/18 showed normal left ventricular systolic function and regional wall motion, LVEF 55%, mild LVH, grade 1 diastolic dysfunction, and an intra-atrial shunt consistent with PFO or secundum ASD. Pulmonary pressures were normal.  Event monitoring in April 2019 showed no significant arrhythmias with PVCs.  Coronary angiography on 08/03/2004 demonstrated a 50-60% stenosis in the proximal left circumflex coronary artery.  He also has a history of hypertension, hyperlipidemia, embolic CVA, and PFO. He is intolerant of aspirin and remains on Plavix.  The patient denies any symptoms of chest pain, palpitations, shortness of breath, lightheadedness, dizziness, leg swelling, orthopnea, PND, and syncope.  He and his wife recently returned from the [place].  Their grandson plays baseball for [employer].   Social history: His wife, Tomi Bamberger, is also my patient.  Review of Systems: As per "subjective", otherwise negative.  Allergies  Allergen Reactions  . Asa [Aspirin] Other (See Comments)    Caused ulcer in stomach / GI bleeding     Current Outpatient Medications  Medication Sig Dispense Refill  . atorvastatin (LIPITOR) 20 MG tablet Take 20 mg by mouth daily.    . clopidogrel (PLAVIX) 75 MG tablet Take 1 tablet (75 mg total) by mouth daily. 90 tablet 3  . doxazosin (CARDURA) 8 MG tablet Take 8 mg by mouth at bedtime.    . finasteride (PROSCAR) 5 MG tablet Take 5 mg by mouth daily.      . fish oil-omega-3 fatty acids 1000 MG capsule Take 1 g by mouth daily.     . Magnesium 500 MG TABS Take 500 mg by mouth daily.     . Melatonin 10 MG TABS Take 1 tablet by mouth at bedtime.    .  nitroGLYCERIN (NITROSTAT) 0.4 MG SL tablet Place 1 tablet (0.4 mg total) under the tongue every 5 (five) minutes as needed. 25 tablet 3   No current facility-administered medications for this visit.     Past Medical History:  Diagnosis Date  . BPH (benign prostatic hyperplasia)   . CAD (coronary artery disease)    nonobstructive  . Cholesterol depletion   . CVA (cerebral vascular accident) ([place]) 2007  . HTN (hypertension)   . Patent foramen ovale    on plavix, followed by Dr. Jenell Milliner    Past Surgical History:  Procedure Laterality Date  . COLONOSCOPY  2003   hemorrhoids,otherwise normal.   . COLONOSCOPY  09/2010   internal hemorrhoids, next TCS in 09/2015  . COLONOSCOPY N/A 07/21/2019   Procedure: COLONOSCOPY;  Surgeon: Daneil Dolin, MD;  [facility];  Service: Endoscopy;  Laterality: N/A;  1:15pm-moved up to 12:00 per Mindy  . ESOPHAGOGASTRODUODENOSCOPY  02/26/11   esophageal erosions consistent with mild relfux/small HH, antral erosions bx mild chronic gastritis and NO h.pylori  . ESOPHAGOGASTRODUODENOSCOPY  09/2010   schatzki ring not manipulated. gastric ulcer with satellite erosions  . GALLBLADDER SURGERY    . INGUINAL HERNIA REPAIR  2006   left    Social History   Socioeconomic History  . Marital status: Married    Spouse name: Not on file  . Number of children: Not on file  . Years of education: Not on file  . Highest education level: Not on file  Occupational History  . Occupation: retired from [employer]  . Financial resource strain: Not on file  . Food insecurity    Worry: Not on file    Inability: Not on file  . Transportation needs    Medical: Not on file    Non-medical: Not on file  Tobacco Use  . Smoking status: Never Smoker  . Smokeless tobacco: Never Used  Substance and Sexual Activity  . Alcohol use: No  . Drug use: No  . Sexual activity: Not on file  Lifestyle  . Physical activity    Days per week: Not on  file    Minutes per session: Not on file  . Stress: Not on file  Relationships  . Social Herbalist on phone: Not on file    Gets together: Not on file    Attends religious service: Not on file    Active member of club or organization: Not on file    Attends meetings of clubs or organizations: Not on file    Relationship status: Not on file  . Intimate partner violence    Fear of current or ex partner: Not on file    Emotionally abused: Not on file    Physically abused: Not on file    Forced sexual activity: Not on file  Other Topics Concern  . Not on file  Social History Narrative  . Not on file     Vitals:   08/20/19 1505  BP: 130/80  Pulse: 70  Temp: 97.9 F (36.6 C)  SpO2: 97%  Weight: 187 lb (84.8 kg)  Height: 5\' 10"  (1.778 m)    Wt Readings from Last 3 Encounters:  08/20/19 187 lb (84.8 kg)  07/14/19 186 lb 3.2 oz (84.5 kg)  01/23/19 183 lb (83 kg)     PHYSICAL EXAM General: NAD HEENT: Normal. Neck: No JVD, no thyromegaly. Lungs: Clear to auscultation bilaterally with normal respiratory effort. CV: Regular rate and rhythm, normal S1/S2, no S3/S4, no murmur. No pretibial or periankle edema.  No carotid bruit.   Abdomen: Soft, nontender, no distention.  Neurologic: Alert and oriented.  Psych: Normal affect. Skin: Normal. Musculoskeletal: No gross deformities.    ECG: Reviewed above under Subjective   Labs: Lab Results  Component Value Date/Time   K 3.7 07/16/2015 10:38 AM   BUN 23 (H) 07/16/2015 10:38 AM   CREATININE 1.34 (H) 07/16/2015 10:38 AM   ALT 35 07/16/2015 10:38 AM   HGB 15.1 07/16/2015 10:38 AM     Lipids: Lab Results  Component Value Date/Time   LDLCALC 92 06/23/2009 11:31 PM   CHOL 162 06/23/2009 11:31 PM   TRIG 113 06/23/2009 11:31 PM   HDL 47 06/23/2009 11:31 PM       ASSESSMENT AND PLAN:  1.  Coronary artery disease: Symptomatically stable.  He is intolerant of aspirin and remains on Plavix.  Continue  atorvastatin 80 mg.  Nuclear stress test and cardiac catheterization results reviewed above.  I will obtain lipids along with a conference of metabolic panel and PSA as per his request.  2.  Palpitations: Event monitoring in April 2019 demonstrated no significant arrhythmias.  He had symptomatic PVCs.  Denies palpitations at present.  3.  Hyperlipidemia: Continue atorvastatin 80 mg.  I will check lipids.  4. History of PFO and CVA: No recurrences. Continue Plavix and atorvastatin.Echocardiogram confirmed evidence of a PFO as detailed above.  5.  Hypertension: Controlled on Cardura.  No changes.  Disposition: Follow up 1 yr   [name], M.D., F.A.C.C.

## 2019-09-10 DIAGNOSIS — I2510 Atherosclerotic heart disease of native coronary artery without angina pectoris: Secondary | ICD-10-CM | POA: Diagnosis not present

## 2019-09-10 DIAGNOSIS — I10 Essential (primary) hypertension: Secondary | ICD-10-CM | POA: Diagnosis not present

## 2019-09-10 DIAGNOSIS — G459 Transient cerebral ischemic attack, unspecified: Secondary | ICD-10-CM | POA: Diagnosis not present

## 2019-09-10 DIAGNOSIS — N401 Enlarged prostate with lower urinary tract symptoms: Secondary | ICD-10-CM | POA: Diagnosis not present

## 2019-09-10 DIAGNOSIS — R944 Abnormal results of kidney function studies: Secondary | ICD-10-CM | POA: Diagnosis not present

## 2019-09-10 DIAGNOSIS — K219 Gastro-esophageal reflux disease without esophagitis: Secondary | ICD-10-CM | POA: Diagnosis not present

## 2019-09-10 DIAGNOSIS — E782 Mixed hyperlipidemia: Secondary | ICD-10-CM | POA: Diagnosis not present

## 2019-09-10 DIAGNOSIS — R7301 Impaired fasting glucose: Secondary | ICD-10-CM | POA: Diagnosis not present

## 2019-09-24 DIAGNOSIS — K219 Gastro-esophageal reflux disease without esophagitis: Secondary | ICD-10-CM | POA: Diagnosis not present

## 2019-09-24 DIAGNOSIS — I10 Essential (primary) hypertension: Secondary | ICD-10-CM | POA: Diagnosis not present

## 2019-09-24 DIAGNOSIS — I2510 Atherosclerotic heart disease of native coronary artery without angina pectoris: Secondary | ICD-10-CM | POA: Diagnosis not present

## 2019-09-24 DIAGNOSIS — N401 Enlarged prostate with lower urinary tract symptoms: Secondary | ICD-10-CM | POA: Diagnosis not present

## 2019-09-24 DIAGNOSIS — R7301 Impaired fasting glucose: Secondary | ICD-10-CM | POA: Diagnosis not present

## 2019-09-24 DIAGNOSIS — R944 Abnormal results of kidney function studies: Secondary | ICD-10-CM | POA: Diagnosis not present

## 2019-09-24 DIAGNOSIS — E782 Mixed hyperlipidemia: Secondary | ICD-10-CM | POA: Diagnosis not present

## 2019-09-24 DIAGNOSIS — G459 Transient cerebral ischemic attack, unspecified: Secondary | ICD-10-CM | POA: Diagnosis not present

## 2019-10-18 ENCOUNTER — Emergency Department (HOSPITAL_COMMUNITY)
Admission: EM | Admit: 2019-10-18 | Discharge: 2019-10-18 | Disposition: A | Discharge status: Home / Self Care | Payer: PPO | Attending: Emergency Medicine | Admitting: Emergency Medicine

## 2019-10-18 ENCOUNTER — Other Ambulatory Visit: Payer: Self-pay

## 2019-10-18 DIAGNOSIS — R5381 Other malaise: Secondary | ICD-10-CM | POA: Insufficient documentation

## 2019-10-18 DIAGNOSIS — Z5321 Procedure and treatment not carried out due to patient leaving prior to being seen by health care provider: Secondary | ICD-10-CM | POA: Insufficient documentation

## 2019-10-18 NOTE — ED Triage Notes (Signed)
Initiated triage, pt stated fever this am and general malaise. Pt reported"i want to get tested since I was running a fever of 100 this morning." Vital signs obtained and pt asked what temperature was. Temperature 97.9. Pt stated " I dont want to take up anyone's time with someone that isn't sick." pt informed would continue to see him and collect information,run tests but pt declined and said would follow up with primary care doctor if still wanted to be tested with COVID-19 on Monday.   Pt ambulated out of department with steady gait. nad noted.

## 2019-12-12 ENCOUNTER — Other Ambulatory Visit: Payer: Self-pay

## 2019-12-12 ENCOUNTER — Ambulatory Visit
Admission: EM | Admit: 2019-12-12 | Discharge: 2019-12-12 | Disposition: A | Discharge status: Home / Self Care | Payer: PPO | Attending: Emergency Medicine | Admitting: Emergency Medicine

## 2019-12-12 DIAGNOSIS — M62838 Other muscle spasm: Secondary | ICD-10-CM | POA: Diagnosis not present

## 2019-12-12 DIAGNOSIS — M542 Cervicalgia: Secondary | ICD-10-CM

## 2019-12-12 DIAGNOSIS — S161XXA Strain of muscle, fascia and tendon at neck level, initial encounter: Secondary | ICD-10-CM | POA: Diagnosis not present

## 2019-12-12 MED ORDER — DEXAMETHASONE SODIUM PHOSPHATE 10 MG/ML IJ SOLN
10.0000 mg | Freq: Once | INTRAMUSCULAR | Status: AC
  Administered 2019-12-12: 10 mg via INTRAMUSCULAR

## 2019-12-12 MED ORDER — TIZANIDINE HCL 2 MG PO CAPS: 2.0000 mg | ORAL_CAPSULE | Freq: Three times a day (TID) | ORAL | 0 refills | Status: DC | PRN

## 2019-12-12 MED ORDER — PREDNISONE 20 MG PO TABS: 20.0000 mg | ORAL_TABLET | Freq: Two times a day (BID) | ORAL | 0 refills | Status: AC

## 2019-12-12 NOTE — Discharge Instructions (Signed)
Steroid shot given in office Continue conservative management of rest, ice, gentle stretches, massage (wife may massage neck also), and heat Take prednisone as prescribed and to completion Take zanaflex at nighttime for symptomatic relief. Avoid driving or operating heavy machinery while using medication. Follow up with PCP if symptoms persist Return or go to the ER if you have any new or worsening symptoms (fever, chills, headache, chest pain, abdominal pain, changes in bowel or bladder habits, pain radiating into lower legs, etc...)

## 2019-12-12 NOTE — ED Triage Notes (Addendum)
Pt presents to UC w/ c/o neck pain which has gotten progressively worse since 4 days ago. Pt denies injury. Pt states pain starts at base of skull and goes down to shoulders. Pt states it is painful to turn head in any direction.

## 2019-12-12 NOTE — ED Provider Notes (Signed)
[facility]   [id number] 12/12/19 Arrival Time: [id number]  CC: Neck PAIN  SUBJECTIVE: History from: patient. [name] [name] is a 73 y.o. male complains of neck pain that began 4 days ago.  Denies a precipitating event or specific injury, however, does admit to installing insulation in his ceiling last week.  Localizes the pain to the back of neck from base of skull to shoulder.  Describes the pain as constant.  "10"/10.  Has tried OTC medications without relief.  Symptoms are made worse with ROM.  Denies similar symptoms in the past.  Denies fever, chills, headache, erythema, ecchymosis, effusion, chest pain, SOB, weakness, numbness and tingling.    ROS: As per HPI.  All other pertinent ROS negative.     Past Medical History:  Diagnosis Date  . BPH (benign prostatic hyperplasia)   . CAD (coronary artery disease)    nonobstructive  . Cholesterol depletion   . CVA (cerebral vascular accident) ([place]) 2007  . HTN (hypertension)   . Patent foramen ovale    on plavix, followed by Dr. Jenell Milliner   Past Surgical History:  Procedure Laterality Date  . COLONOSCOPY  2003   hemorrhoids,otherwise normal.   . COLONOSCOPY  09/2010   internal hemorrhoids, next TCS in 09/2015  . COLONOSCOPY N/A 07/21/2019   Procedure: COLONOSCOPY;  Surgeon: Daneil Dolin, MD;  [facility];  Service: Endoscopy;  Laterality: N/A;  1:15pm-moved up to 12:00 per Mindy  . ESOPHAGOGASTRODUODENOSCOPY  02/26/11   esophageal erosions consistent with mild relfux/small HH, antral erosions bx mild chronic gastritis and NO h.pylori  . ESOPHAGOGASTRODUODENOSCOPY  09/2010   schatzki ring not manipulated. gastric ulcer with satellite erosions  . GALLBLADDER SURGERY    . INGUINAL HERNIA REPAIR  2006   left   Allergies  Allergen Reactions  . Asa [Aspirin] Other (See Comments)    Caused ulcer in stomach / GI bleeding    No current facility-administered medications on file prior to encounter.   Current  Outpatient Medications on File Prior to Encounter  Medication Sig Dispense Refill  . atorvastatin (LIPITOR) 20 MG tablet Take 20 mg by mouth daily.    . clopidogrel (PLAVIX) 75 MG tablet Take 1 tablet (75 mg total) by mouth daily. 90 tablet 3  . doxazosin (CARDURA) 8 MG tablet Take 8 mg by mouth at bedtime.    . finasteride (PROSCAR) 5 MG tablet Take 5 mg by mouth daily.      . fish oil-omega-3 fatty acids 1000 MG capsule Take 1 g by mouth daily.     . Magnesium 500 MG TABS Take 500 mg by mouth daily.     . Melatonin 10 MG TABS Take 1 tablet by mouth at bedtime.    . nitroGLYCERIN (NITROSTAT) 0.4 MG SL tablet Place 1 tablet (0.4 mg total) under the tongue every 5 (five) minutes as needed. 25 tablet 3   Social History   Socioeconomic History  . Marital status: Married    Spouse name: Not on file  . Number of children: Not on file  . Years of education: Not on file  . Highest education level: Not on file  Occupational History  . Occupation: retired from [employer]  . Smoking status: Never Smoker  . Smokeless tobacco: Never Used  Substance and Sexual Activity  . Alcohol use: No  . Drug use: No  . Sexual activity: Not on file  Other Topics Concern  . Not on file  Social History Narrative  . Not on file   Social Determinants of Health   Financial Resource Strain:   . Difficulty of Paying Living Expenses: Not on file  Food Insecurity:   . Worried About Charity fundraiser in the Last Year: Not on file  . Ran Out of Food in the Last Year: Not on file  Transportation Needs:   . Lack of Transportation (Medical): Not on file  . Lack of Transportation (Non-Medical): Not on file  Physical Activity:   . Days of Exercise per Week: Not on file  . Minutes of Exercise per Session: Not on file  Stress:   . Feeling of Stress : Not on file  Social Connections:   . Frequency of Communication with Friends and Family: Not on file  . Frequency of Social Gatherings with  Friends and Family: Not on file  . Attends Religious Services: Not on file  . Active Member of Clubs or Organizations: Not on file  . Attends Archivist Meetings: Not on file  . Marital Status: Not on file  Intimate Partner Violence:   . Fear of Current or Ex-Partner: Not on file  . Emotionally Abused: Not on file  . Physically Abused: Not on file  . Sexually Abused: Not on file   Family History  Problem Relation Age of Onset  . Other Brother        patient had colostomy at young age with long history of colon issues. previously reported possible colon cancer in his 68s but patient not sure about this  . Colon polyps Mother        advanced age, died secondary to AAA rupture  . COPD Father     OBJECTIVE:  Vitals:   12/12/19 1134  BP: 130/73  Pulse: 74  Resp: 18  Temp: 97.8 F (36.6 C)  TempSrc: Oral  SpO2: 93%    General appearance: ALERT; in no acute distress.  Head: NCAT Lungs: Normal respiratory effort; CTAB CV: RRR Musculoskeletal: Neck Inspection: Skin warm, dry, clear and intact without obvious erythema, effusion, or ecchymosis.  Palpation: Diffusely TTP over posterior neck, no midline tenderness or step-off; TTP over trapezius, with palpable spasm over RT superior aspect ROM: LROM Skin: warm and dry Neurologic: Ambulates without difficulty; Sensation intact about the upper extremities Psychological: alert and cooperative; normal mood and affect   ASSESSMENT & PLAN:  1. Neck pain   2. Strain of neck muscle, initial encounter   3. Muscle spasm     Meds ordered this encounter  Medications  . dexamethasone (DECADRON) injection 10 mg  . predniSONE (DELTASONE) 20 MG tablet    Sig: Take 1 tablet (20 mg total) by mouth 2 (two) times daily with a meal for 5 days.    Dispense:  10 tablet    Refill:  0    Order Specific Question:   Supervising Provider    Answer:   Raylene Everts [id number]  . tizanidine (ZANAFLEX) 2 MG capsule    Sig: Take 1  capsule (2 mg total) by mouth 3 (three) times daily as needed for muscle spasms.    Dispense:  15 capsule    Refill:  0    Order Specific Question:   Supervising Provider    Answer:   Raylene Everts [id number]   Steroid shot given in office Continue conservative management of rest, ice, gentle stretches, massage (wife may massage neck also), and heat Take prednisone as prescribed and to completion Take  zanaflex at nighttime for symptomatic relief. Avoid driving or operating heavy machinery while using medication. Follow up with PCP if symptoms persist Return or go to the ER if you have any new or worsening symptoms (fever, chills, headache, chest pain, abdominal pain, changes in bowel or bladder habits, pain radiating into lower legs, etc...)   Reviewed expectations re: course of current medical issues. Questions answered. Outlined signs and symptoms indicating need for more acute intervention. Patient verbalized understanding. After Visit Summary given.    Lestine Box, PA-C 12/12/19 1223

## 2020-01-20 ENCOUNTER — Other Ambulatory Visit: Payer: Self-pay

## 2020-01-20 DIAGNOSIS — N401 Enlarged prostate with lower urinary tract symptoms: Secondary | ICD-10-CM

## 2020-01-20 MED ORDER — DOXAZOSIN MESYLATE 8 MG PO TABS: 8.0000 mg | ORAL_TABLET | Freq: Every day | ORAL | 3 refills | Status: DC

## 2020-03-22 ENCOUNTER — Encounter: Payer: Self-pay | Admitting: Urology

## 2020-03-22 ENCOUNTER — Ambulatory Visit (INDEPENDENT_AMBULATORY_CARE_PROVIDER_SITE_OTHER): Payer: PPO | Admitting: Urology

## 2020-03-22 ENCOUNTER — Other Ambulatory Visit: Payer: Self-pay | Admitting: Urology

## 2020-03-22 ENCOUNTER — Other Ambulatory Visit: Payer: Self-pay

## 2020-03-22 VITALS — BP 149/72 | HR 65 | Temp 97.6°F | Wt 190.0 lb

## 2020-03-22 DIAGNOSIS — N401 Enlarged prostate with lower urinary tract symptoms: Secondary | ICD-10-CM

## 2020-03-22 LAB — POCT URINALYSIS DIPSTICK
Appearance: NEGATIVE
Bilirubin, UA: NEGATIVE
Blood, UA: NEGATIVE
Glucose, UA: NEGATIVE
Ketones, UA: NEGATIVE
Leukocytes, UA: NEGATIVE
Nitrite, UA: NEGATIVE
Protein, UA: POSITIVE — AB
Spec Grav, UA: 1.025 (ref 1.010–1.025)
Urobilinogen, UA: 0.2 E.U./dL
pH, UA: 5 (ref 5.0–8.0)

## 2020-03-22 LAB — PSA: PSA: 1.4 ng/mL (ref <=4.0)

## 2020-03-22 MED ORDER — DOXAZOSIN MESYLATE 8 MG PO TABS: 8.0000 mg | ORAL_TABLET | Freq: Every day | ORAL | 3 refills | Status: DC

## 2020-03-22 MED ORDER — FINASTERIDE 5 MG PO TABS: 5.0000 mg | ORAL_TABLET | Freq: Every day | ORAL | 3 refills | Status: DC

## 2020-03-22 NOTE — Progress Notes (Signed)
H&P  Chief Complaint: BPH  History of Present Illness:   4.6.2021: He returns today for follow-up and to refill his medications. He denies any significant changes to voiding sx's in the last year. No significant complaints at today's visit. He continues on finasteride and doxazosin. He does still have a high baseline frequency (especially at night). He does not limit evening fluid intake -- he drinks 3-4 beers a night. He denies any issues with ED and is not sexually active. He does express some interest in a procedure like urolift if only to be able to come off medications.  IPSS Questionnaire (AUA-7): Over the past month.   1)  How often have you had a sensation of not emptying your bladder completely after you finish urinating?  2 - Less than half the time  2)  How often have you had to urinate again less than two hours after you finished urinating? 2 - Less than half the time  3)  How often have you found you stopped and started again several times when you urinated?  5 - Almost always  4) How difficult have you found it to postpone urination?  2 - Less than half the time  5) How often have you had a weak urinary stream?  4 - More than half the time  6) How often have you had to push or strain to begin urination?  2 - Less than half the time  7) How many times did you most typically get up to urinate from the time you went to bed until the time you got up in the morning?  3 - 3 times  Total score:  0-7 mildly symptomatic   8-19 moderately symptomatic   20-35 severely symptomatic   Total: 20 QoL: 4  (below copied from [facility] records):  BPH:  [name] [name] is a 74 year-old male established patient who is here for follow up regarding further evaluation of BPH and lower urinary tract symptoms.  Patient is currently treated with doxazosin, finasteride for his symptoms.   IPSS 16  QOL score 4   He is still on Cardura and finasteride. Except for nighttime frequency, he has no real  complaints. He has noticed lower extremity swelling over the past few months.   Past Medical History:  Diagnosis Date  . BPH (benign prostatic hyperplasia)   . CAD (coronary artery disease)    nonobstructive  . Cholesterol depletion   . CVA (cerebral vascular accident) ([place]) 2007  . HTN (hypertension)   . Patent foramen ovale    on plavix, followed by Dr. Jenell Milliner    Past Surgical History:  Procedure Laterality Date  . COLONOSCOPY  2003   hemorrhoids,otherwise normal.   . COLONOSCOPY  09/2010   internal hemorrhoids, next TCS in 09/2015  . COLONOSCOPY N/A 07/21/2019   Procedure: COLONOSCOPY;  Surgeon: Daneil Dolin, MD;  [facility];  Service: Endoscopy;  Laterality: N/A;  1:15pm-moved up to 12:00 per Mindy  . ESOPHAGOGASTRODUODENOSCOPY  02/26/11   esophageal erosions consistent with mild relfux/small HH, antral erosions bx mild chronic gastritis and NO h.pylori  . ESOPHAGOGASTRODUODENOSCOPY  09/2010   schatzki ring not manipulated. gastric ulcer with satellite erosions  . GALLBLADDER SURGERY    . INGUINAL HERNIA REPAIR  2006   left    Home Medications:  Allergies as of 03/22/2020      Reactions   Asa [aspirin] Other (See Comments)   Caused ulcer in stomach / GI bleeding  Medication List       Accurate as of March 22, 2020 10:09 AM. If you have any questions, ask your nurse or doctor.        STOP taking these medications   tizanidine 2 MG capsule Commonly known as: Zanaflex Stopped by: Jorja Loa, MD     TAKE these medications   atorvastatin 20 MG tablet Commonly known as: LIPITOR Take 20 mg by mouth daily.   clopidogrel 75 MG tablet Commonly known as: PLAVIX Take 1 tablet (75 mg total) by mouth daily.   doxazosin 8 MG tablet Commonly known as: CARDURA Take 1 tablet (8 mg total) by mouth at bedtime.   finasteride 5 MG tablet Commonly known as: PROSCAR Take 5 mg by mouth daily.   fish oil-omega-3 fatty acids 1000 MG  capsule Take 1 g by mouth daily.   Magnesium 500 MG Tabs Take 500 mg by mouth daily.   Melatonin 10 MG Tabs Take 1 tablet by mouth at bedtime.   nitroGLYCERIN 0.4 MG SL tablet Commonly known as: NITROSTAT Place 1 tablet (0.4 mg total) under the tongue every 5 (five) minutes as needed.       Allergies:  Allergies  Allergen Reactions  . Asa [Aspirin] Other (See Comments)    Caused ulcer in stomach / GI bleeding     Family History  Problem Relation Age of Onset  . Other Brother        patient had colostomy at young age with long history of colon issues. previously reported possible colon cancer in his 35s but patient not sure about this  . Colon polyps Mother        advanced age, died secondary to AAA rupture  . COPD Father     Social History:  reports that he has never smoked. He has never used smokeless tobacco. He reports that he does not drink alcohol or use drugs.  ROS: A complete review of systems was performed.  All systems are negative except for pertinent findings as noted.  Physical Exam:  Vital signs in last 24 hours: BP (!) 149/72   Pulse 65   Temp 97.6 F (36.4 C)   Wt 190 lb (86.2 kg)   BMI 27.26 kg/m  Constitutional:  Alert and oriented, No acute distress Cardiovascular: Regular rate  Respiratory: Normal respiratory effort GI: Abdomen is soft, nontender, nondistended, no abdominal masses. No CVAT. No hernias.  Genitourinary: Normal male phallus, testes are descended bilaterally and non-tender and without masses, left atrophic testicle, scrotum is normal in appearance without lesions or masses, perineum is normal on inspection. Prostate feels around 60 grams in size. Lymphatic: No lymphadenopathy Neurologic: Grossly intact, no focal deficits Psychiatric: Normal mood and affect  Laboratory Data:  No results for input(s): WBC, HGB, HCT, PLT in the last 72 hours.  No results for input(s): NA, K, CL, GLUCOSE, BUN, CALCIUM, CREATININE in the last 72  hours.  Invalid input(s): CO3   Results for orders placed or performed in visit on 03/22/20 (from the past 24 hour(s))  POCT urinalysis dipstick     Status: Abnormal   Collection Time: 03/22/20 10:00 AM  Result Value Ref Range   Color, UA yellow    Clarity, UA     Glucose, UA Negative Negative   Bilirubin, UA neg    Ketones, UA neg    Spec Grav, UA 1.025 1.010 - 1.025   Blood, UA neg    pH, UA 5.0 5.0 - 8.0  Protein, UA Positive (A) Negative   Urobilinogen, UA 0.2 0.2 or 1.0 E.U./dL   Nitrite, UA neg    Leukocytes, UA Negative Negative   Appearance neg    Odor       I have reviewed prior pt notes  I have reviewed notes from referring/previous physicians  I have reviewed urinalysis results  I have reviewed prior PSA results  I have reviewed prior urine culture  Impression/Assessment:  His nocturia is more than likely exacerbated by polyuria secondary to his peripheral edema as well as his evening fluid (more specifically beer) intake.  Plan:  1. Given general advice for non medical management of nocturia -- limit sodium, limit evening fluids, wear compression garments, etc. He may also want to discuss starting on fluid pill with his PCP.   2. Information provided on TURP and urolift -- he will call if interested in being evaluated for these procedures.    3. Return in 2 yrs for OV. He is fine to call for med refills prior.

## 2020-03-22 NOTE — Progress Notes (Signed)
Urological Symptom Review  Patient is experiencing the following symptoms: Frequent urination Get up at night to urinate Stream starts and stops Trouble starting stream   Review of Systems  Gastrointestinal (upper)  : Negative for upper GI symptoms  Gastrointestinal (lower) : Negative for lower GI symptoms  Constitutional : Negative for symptoms  Skin: Negative for skin symptoms  Eyes: Negative for eye symptoms  Ear/Nose/Throat : Negative for Ear/Nose/Throat symptoms  Hematologic/Lymphatic: Negative for Hematologic/Lymphatic symptoms  Cardiovascular : Negative for cardiovascular symptoms  Respiratory : Negative for respiratory symptoms  Endocrine: Negative for endocrine symptoms  Musculoskeletal: Negative for musculoskeletal symptoms  Neurological: Negative for neurological symptoms  Psychologic: Negative for psychiatric symptoms

## 2020-03-24 DIAGNOSIS — M542 Cervicalgia: Secondary | ICD-10-CM | POA: Diagnosis not present

## 2020-03-30 ENCOUNTER — Telehealth: Payer: Self-pay

## 2020-03-30 NOTE — Telephone Encounter (Signed)
Sent via mychart

## 2020-03-30 NOTE — Telephone Encounter (Signed)
-----   Message from Franchot Gallo, MD sent at 03/29/2020  5:06 PM EDT ----- Sorry, I did not know/see his note from last week.  No, no new follow-up needed. ----- Message ----- From: Dorisann Frames, RN Sent: 03/29/2020   8:44 AM EDT To: Franchot Gallo, MD  You saw this man on 4/6. Does he need to come back? ----- Message ----- From: Franchot Gallo, MD Sent: 03/29/2020   8:39 AM EDT To: Dorisann Frames, RN  Notify pt--psa 1.4. Needs OV ----- Message ----- From: Dorisann Frames, RN Sent: 03/23/2020  12:37 PM EDT To: Franchot Gallo, MD  Please review

## 2020-04-14 ENCOUNTER — Other Ambulatory Visit: Payer: Self-pay

## 2020-04-14 ENCOUNTER — Ambulatory Visit (HOSPITAL_COMMUNITY): Payer: PPO | Attending: Neurosurgery | Admitting: Physical Therapy

## 2020-04-14 ENCOUNTER — Encounter (HOSPITAL_COMMUNITY): Payer: Self-pay | Admitting: Physical Therapy

## 2020-04-14 DIAGNOSIS — M5412 Radiculopathy, cervical region: Secondary | ICD-10-CM

## 2020-04-14 NOTE — Therapy (Signed)
[facility] [place], [place], [address] Phone: [phone]   Fax:  [phone]  Physical Therapy Evaluation  Patient Details  Name: [name] [name] MRN: [id number] Date of Birth: Nov 29, 1946 Referring Provider (PT): Kary Kos   Encounter Date: 04/14/2020  PT End of Session - 04/14/20 0956    Visit Number  1    Number of Visits  12    Date for PT Re-Evaluation  05/14/20    Progress Note Due on Visit  10    PT Start Time  0830    PT Stop Time  0915    PT Time Calculation (min)  45 min    Activity Tolerance  Patient tolerated treatment well    Behavior During Therapy  [facility] for tasks assessed/performed       Past Medical History:  Diagnosis Date  . BPH (benign prostatic hyperplasia)   . CAD (coronary artery disease)    nonobstructive  . Cholesterol depletion   . CVA (cerebral vascular accident) ([place]) 2007  . HTN (hypertension)   . Patent foramen ovale    on plavix, followed by Dr. Jenell Milliner    Past Surgical History:  Procedure Laterality Date  . COLONOSCOPY  2003   hemorrhoids,otherwise normal.   . COLONOSCOPY  09/2010   internal hemorrhoids, next TCS in 09/2015  . COLONOSCOPY N/A 07/21/2019   Procedure: COLONOSCOPY;  Surgeon: Daneil Dolin, MD;  [facility];  Service: Endoscopy;  Laterality: N/A;  1:15pm-moved up to 12:00 per Mindy  . ESOPHAGOGASTRODUODENOSCOPY  02/26/11   esophageal erosions consistent with mild relfux/small HH, antral erosions bx mild chronic gastritis and NO h.pylori  . ESOPHAGOGASTRODUODENOSCOPY  09/2010   schatzki ring not manipulated. gastric ulcer with satellite erosions  . GALLBLADDER SURGERY    . INGUINAL HERNIA REPAIR  2006   left    There were no vitals filed for this visit.   Subjective Assessment - 04/14/20 0839    Subjective  [name] [name] states that his neck began to bother him late December 2020 after installing instalation.  He got to the point where he could not  brush his teeth.  He went to urgent care and was given a prednisone pak which helped.  He is still having significant difficulty turning his head and looking up.  He  is having pain going down to both shoulders.    Pertinent History  OA, HTN, CVA    Limitations  House hold activities    How long can you sit comfortably?  n    Patient Stated Goals  To be able to sleep better, to be able to turn his head better and to be able to get back to working on his house.    Currently in Pain?  Yes    Pain Score  6    goes as high as a 8-9 at night   Pain Location  Neck    Pain Orientation  Mid;Lower    Pain Descriptors / Indicators  Aching    Pain Type  Chronic pain    Pain Onset  More than a month ago    Pain Frequency  Constant    Aggravating Factors   looking up or turning his head    Pain Relieving Factors  hot shower    Effect of Pain on Daily Activities  keeps going with the pain         [facility] PT Assessment - 04/14/20 0001  Assessment   Medical Diagnosis  cervical radiculopathy    Referring Provider (PT)  Kary Kos    Onset Date/Surgical Date  12/06/19    Prior Therapy  none      Precautions   Precautions  None      Restrictions   Weight Bearing Restrictions  No      Balance Screen   Has the patient fallen in the past 6 months  No    Has the patient had a decrease in activity level because of a fear of falling?   No    Is the patient reluctant to leave their home because of a fear of falling?   No      Home Film/video editor residence      Prior Function   Level of Independence  Independent      Cognition   Overall Cognitive Status  Within Functional Limits for tasks assessed      Observation/Other Assessments   Focus on Therapeutic Outcomes (FOTO)   43; 57% liimited       Posture/Postural Control   Posture/Postural Control  Postural limitations    Postural Limitations  Rounded Shoulders;Forward head;Increased thoracic kyphosis      ROM /  Strength   AROM / PROM / Strength  AROM;Strength      AROM   AROM Assessment Site  Cervical    Cervical Flexion  55    Cervical Extension  50   reps do not change    Cervical - Right Side Bend  20    Cervical - Left Side Bend  30    Cervical - Right Rotation  40    Cervical - Left Rotation  45      Strength   Strength Assessment Site  Cervical    Cervical Extension  4/5    Cervical - Right Side Bend  3/5    Cervical - Left Side Bend  3+/5      Palpation   Palpation comment  significant spasm in B upper trap                 Objective measurements completed on examination: See above findings.      [place] Adult PT Treatment/Exercise - 04/14/20 0001      Exercises   Exercises  Neck      Neck Exercises: Supine   Neck Retraction  10 reps;3 secs    Neck Retraction Limitations  scapular retraction 10 reps 3 seconds       Manual Therapy   Manual Therapy  Soft tissue mobilization;Manual Traction    Manual therapy comments  done seperate from all other treatment    Soft tissue mobilization  to decrease mm spasm    Manual Traction  to decrease pain and relieve pressure on nerve.             PT Education - 04/14/20 0951    Education Details  HEP    Person(s) Educated  Patient    Methods  Explanation;Handout    Comprehension  Verbalized understanding       PT Short Term Goals - 04/14/20 1027      PT SHORT TERM GOAL #1   Title  PT to be I in HEP to allow pain level of cervical area to be no greater than a 5/10    Time  2    Period  Weeks    Status  New    Target Date  04/28/20      PT SHORT TERM GOAL #2   Title  PT to be able to sleep for four hours straight without waking due to cervical pain    Time  2    Period  Weeks    Status  New      PT SHORT TERM GOAL #3   Title  Pt to increase cervical rotation by 10 degrees both directions to allow safer driving    Time  2    Period  Weeks        PT Long Term Goals - 04/14/20 1029      PT LONG  TERM GOAL #1   Title  PT to be I in advanced HEP to allow pain in cervical area to be no greater than a 2/10    Time  4    Period  Weeks    Status  New    Target Date  05/12/20      PT LONG TERM GOAL #2   Title  PT to be getting 6 or greater hours of sleep a night    Time  6    Period  Weeks    Status  New      PT LONG TERM GOAL #3   Title  Pt cervical ROM to be improved 20 degrees in both directions to improve safety of driving.    Time  4    Period  Weeks    Status  New      PT LONG TERM GOAL #4   Title  Pt cervical mm to be at least a 4+/5 to allow pt  to continue home improvements without increased cervical pain.    Time  4    Period  Weeks    Status  New             Plan - 04/14/20 1015    Clinical Impression Statement  [name] [name] is a 74 yo who has had significant increased cervical pain following putting insulation in his home as well as a warming unit into his bathroom floors.  The pain goes to both shoulders but does not go into his arms.  His pain is greatest at night disrupting his sleep.  At this time he is being referred to skilled physical therapy.  Evaluation demonstrates increased pain, increased muscle spasm,  decreased ROM, decreased strength, and postural changes.  [name] [name] will benefit from skilled therapyt to address these issues and maximize his functional ability.    Examination-Activity Limitations  Reach Overhead;Other    Examination-Participation Restrictions  Other;Driving    Stability/Clinical Decision Making  Stable/Uncomplicated    Clinical Decision Making  Low    Rehab Potential  Good    PT Frequency  3x / week    PT Duration  4 weeks    PT Treatment/Interventions  ADLs/Self Care Home Management;Traction;Moist Heat;Therapeutic exercise;Patient/family education;Manual techniques;Dry needling    PT Next Visit Plan  Begin cervical ROM and isometrics. continue with manual (pt prefers sitting ) Progress cervical stabilization as able .    PT  Home Exercise Plan  supines cervical and scapular retraction       Patient will benefit from skilled therapeutic intervention in order to improve the following deficits and impairments:  Decreased range of motion, Increased muscle spasms, Increased fascial restricitons, Pain, Decreased strength, Postural dysfunction  Visit Diagnosis: Radiculopathy, cervical region - Plan: PT plan of care cert/re-cert     Problem List Patient Active Problem List  Diagnosis Date Noted  . FH: colon polyps 07/14/2019  . Hyperlipidemia LDL goal < 100 07/09/2013  . History of CVA (cerebrovascular accident) without residual deficits 07/09/2013  . PUD (peptic ulcer disease) 06/03/2012    Class: History of  . FH: colon cancer 06/03/2012  . GERD (gastroesophageal reflux disease) 06/03/2012  . Patent foramen ovale 05/21/2012  . MELENA 08/31/2010  . EPIGASTRIC PAIN 08/31/2010  . HYPERTENSION, BENIGN 07/04/2010  . CAD, NATIVE VESSEL 07/04/2010  . KNEE, ARTHRITIS, DEGEN./OSTEO 12/13/2009  . DERANGEMENT MENISCUS 12/13/2009  . SHOULDER PAIN 12/30/2008  . IMPINGEMENT SYNDROME 12/30/2008    Rayetta Humphrey, PT CLT [phone] 04/14/2020, 10:35 AM  [facility] [address] [place], [place], [address] Phone: [phone]   Fax:  [phone]  Name: [name] [name] MRN: [id number] Date of Birth: [birth year]

## 2020-04-18 ENCOUNTER — Encounter (HOSPITAL_COMMUNITY): Payer: Self-pay | Admitting: Physical Therapy

## 2020-04-18 ENCOUNTER — Ambulatory Visit (HOSPITAL_COMMUNITY): Payer: PPO | Attending: Neurosurgery | Admitting: Physical Therapy

## 2020-04-18 ENCOUNTER — Other Ambulatory Visit: Payer: Self-pay

## 2020-04-18 DIAGNOSIS — M542 Cervicalgia: Secondary | ICD-10-CM | POA: Insufficient documentation

## 2020-04-18 DIAGNOSIS — M5412 Radiculopathy, cervical region: Secondary | ICD-10-CM | POA: Diagnosis not present

## 2020-04-18 NOTE — Therapy (Signed)
[facility] [place], [place], [address] Phone: [phone]   Fax:  [phone]  Physical Therapy Treatment  Patient Details  Name: [name] [name] MRN: [id number] Date of Birth: 11-17-1946 Referring Provider (PT): Kary Kos   Encounter Date: 04/18/2020  PT End of Session - 04/18/20 1614    Visit Number  2    Number of Visits  12    Date for PT Re-Evaluation  05/14/20    Progress Note Due on Visit  10    PT Start Time  [id number]    PT Stop Time  1655    PT Time Calculation (min)  40 min    Activity Tolerance  Patient tolerated treatment well    Behavior During Therapy  [facility] for tasks assessed/performed       Past Medical History:  Diagnosis Date  . BPH (benign prostatic hyperplasia)   . CAD (coronary artery disease)    nonobstructive  . Cholesterol depletion   . CVA (cerebral vascular accident) ([place]) 2007  . HTN (hypertension)   . Patent foramen ovale    on plavix, followed by Dr. Jenell Milliner    Past Surgical History:  Procedure Laterality Date  . COLONOSCOPY  2003   hemorrhoids,otherwise normal.   . COLONOSCOPY  09/2010   internal hemorrhoids, next TCS in 09/2015  . COLONOSCOPY N/A 07/21/2019   Procedure: COLONOSCOPY;  Surgeon: Daneil Dolin, MD;  [facility];  Service: Endoscopy;  Laterality: N/A;  1:15pm-moved up to 12:00 per Mindy  . ESOPHAGOGASTRODUODENOSCOPY  02/26/11   esophageal erosions consistent with mild relfux/small HH, antral erosions bx mild chronic gastritis and NO h.pylori  . ESOPHAGOGASTRODUODENOSCOPY  09/2010   schatzki ring not manipulated. gastric ulcer with satellite erosions  . GALLBLADDER SURGERY    . INGUINAL HERNIA REPAIR  2006   left    There were no vitals filed for this visit.  Subjective Assessment - 04/18/20 1616    Subjective  States pain is about the same a 4/10 at the center of his back and across shoulders.    Pertinent History  OA, HTN, CVA    Limitations  House  hold activities    How long can you sit comfortably?  n    Patient Stated Goals  To be able to sleep better, to be able to turn his head better and to be able to get back to working on his house.    Currently in Pain?  Yes    Pain Score  4     Pain Location  Neck    Pain Descriptors / Indicators  Aching    Pain Onset  More than a month ago         [facility] PT Assessment - 04/18/20 0001      Assessment   Medical Diagnosis  cervical radiculopathy    Referring Provider (PT)  Kary Kos    Onset Date/Surgical Date  12/06/19                   [facility] Adult PT Treatment/Exercise - 04/18/20 0001      Neck Exercises: Standing   Other Standing Exercises  pec stretch in doorway - x5, 15" holds     Other Standing Exercises  serratus plus on table 2x10 5" holds B      Neck Exercises: Supine   Other Supine Exercise  towel roll down thoracis spine  2 minutes, then scapular retration 2x10, 5" holds  Manual Therapy   Manual Therapy  Soft tissue mobilization;Manual Traction;Joint mobilization    Manual therapy comments  done seperate from all other treatment    Joint Mobilization  medial glides to C5-7 Bilaterally grade II/III tolerated well with and without ROT, CPA to C7- C6 grade II/III with passive extension tolerated well, UPA to C4-6 grade II/III - tolerated well- performed bilaterally     Manual Traction  gentle cervical traction in supine               PT Short Term Goals - 04/14/20 1027      PT SHORT TERM GOAL #1   Title  PT to be I in HEP to allow pain level of cervical area to be no greater than a 5/10    Time  2    Period  Weeks    Status  New    Target Date  04/28/20      PT SHORT TERM GOAL #2   Title  PT to be able to sleep for four hours straight without waking due to cervical pain    Time  2    Period  Weeks    Status  New      PT SHORT TERM GOAL #3   Title  Pt to increase cervical rotation by 10 degrees both directions to allow safer driving     Time  2    Period  Weeks        PT Long Term Goals - 04/14/20 1029      PT LONG TERM GOAL #1   Title  PT to be I in advanced HEP to allow pain in cervical area to be no greater than a 2/10    Time  4    Period  Weeks    Status  New    Target Date  05/12/20      PT LONG TERM GOAL #2   Title  PT to be getting 6 or greater hours of sleep a night    Time  6    Period  Weeks    Status  New      PT LONG TERM GOAL #3   Title  Pt cervical ROM to be improved 20 degrees in both directions to improve safety of driving.    Time  4    Period  Weeks    Status  New      PT LONG TERM GOAL #4   Title  Pt cervical mm to be at least a 4+/5 to allow pt  to continue home improvements without increased cervical pain.    Time  4    Period  Weeks    Status  New            Plan - 04/18/20 1614    Clinical Impression Statement  Tolerated session well. Improved cervical extension noted and decreased pain with manual work and exercises. Cervical rotation initially improved and then became painful again after scapular protraction exercises. Will focus more on rotation next session. Less pain noted end of session and improved ease of movement per patient.    Examination-Activity Limitations  Reach Overhead;Other    Examination-Participation Restrictions  Other;Driving    Stability/Clinical Decision Making  Stable/Uncomplicated    Rehab Potential  Good    PT Frequency  3x / week    PT Duration  4 weeks    PT Treatment/Interventions  ADLs/Self Care Home Management;Traction;Moist Heat;Therapeutic exercise;Patient/family education;Manual techniques;Dry needling    PT  Next Visit Plan  cervical ROM/isometrics, focus on movements/muscles for ROT. continue with manual . thoraicic mobility (ROT, flexion/extension) and cervical retraction/protraction    PT Home Exercise Plan  supines cervical and scapular retraction; 5/3 CCK scapular protraction, towel roll down back and pec stretch at doorway     Consulted and Agree with Plan of Care  Patient       Patient will benefit from skilled therapeutic intervention in order to improve the following deficits and impairments:  Decreased range of motion, Increased muscle spasms, Increased fascial restricitons, Pain, Decreased strength, Postural dysfunction  Visit Diagnosis: Radiculopathy, cervical region     Problem List Patient Active Problem List   Diagnosis Date Noted  . FH: colon polyps 07/14/2019  . Hyperlipidemia LDL goal < 100 07/09/2013  . History of CVA (cerebrovascular accident) without residual deficits 07/09/2013  . PUD (peptic ulcer disease) 06/03/2012    Class: History of  . FH: colon cancer 06/03/2012  . GERD (gastroesophageal reflux disease) 06/03/2012  . Patent foramen ovale 05/21/2012  . MELENA 08/31/2010  . EPIGASTRIC PAIN 08/31/2010  . HYPERTENSION, BENIGN 07/04/2010  . CAD, NATIVE VESSEL 07/04/2010  . KNEE, ARTHRITIS, DEGEN./OSTEO 12/13/2009  . DERANGEMENT MENISCUS 12/13/2009  . SHOULDER PAIN 12/30/2008  . IMPINGEMENT SYNDROME 12/30/2008   5:06 PM, 04/18/20 Jerene Pitch, DPT Physical Therapy with [facility]  [phone] office  [facility] [address] [place], [place], [address] Phone: [phone]   Fax:  [phone]  Name: Koki Pillado MRN: [id number] Date of Birth: [birth year]

## 2020-04-20 ENCOUNTER — Other Ambulatory Visit: Payer: Self-pay

## 2020-04-20 ENCOUNTER — Ambulatory Visit (HOSPITAL_COMMUNITY): Payer: PPO | Admitting: Physical Therapy

## 2020-04-20 DIAGNOSIS — M542 Cervicalgia: Secondary | ICD-10-CM

## 2020-04-20 DIAGNOSIS — M5412 Radiculopathy, cervical region: Secondary | ICD-10-CM | POA: Diagnosis not present

## 2020-04-20 NOTE — Therapy (Signed)
[facility] [address] [place], [place], [address] Phone: [phone]   Fax:  [phone]  Physical Therapy Treatment  Patient Details  Name: [name] [name] MRN: [id number] Date of Birth: 20-Nov-1946 Referring Provider (PT): Kary Kos   Encounter Date: 04/20/2020  PT End of Session - 04/20/20 0907    Visit Number  3    Number of Visits  12    Date for PT Re-Evaluation  05/14/20    Progress Note Due on Visit  10    PT Start Time  0830    PT Stop Time  0908    PT Time Calculation (min)  38 min    Activity Tolerance  Patient tolerated treatment well    Behavior During Therapy  [facility] for tasks assessed/performed       Past Medical History:  Diagnosis Date  . BPH (benign prostatic hyperplasia)   . CAD (coronary artery disease)    nonobstructive  . Cholesterol depletion   . CVA (cerebral vascular accident) ([place]) 2007  . HTN (hypertension)   . Patent foramen ovale    on plavix, followed by Dr. Jenell Milliner    Past Surgical History:  Procedure Laterality Date  . COLONOSCOPY  2003   hemorrhoids,otherwise normal.   . COLONOSCOPY  09/2010   internal hemorrhoids, next TCS in 09/2015  . COLONOSCOPY N/A 07/21/2019   Procedure: COLONOSCOPY;  Surgeon: Daneil Dolin, MD;  [facility];  Service: Endoscopy;  Laterality: N/A;  1:15pm-moved up to 12:00 per Mindy  . ESOPHAGOGASTRODUODENOSCOPY  02/26/11   esophageal erosions consistent with mild relfux/small HH, antral erosions bx mild chronic gastritis and NO h.pylori  . ESOPHAGOGASTRODUODENOSCOPY  09/2010   schatzki ring not manipulated. gastric ulcer with satellite erosions  . GALLBLADDER SURGERY    . INGUINAL HERNIA REPAIR  2006   left    There were no vitals filed for this visit.  Subjective Assessment - 04/20/20 0832    Subjective  Pt states he assisted in putting in an air conditioning unit in his church yesterday which really aggrevated his pain.    Pertinent History  OA,  HTN, CVA    Limitations  House hold activities    How long can you sit comfortably?  n    Patient Stated Goals  To be able to sleep better, to be able to turn his head better and to be able to get back to working on his house.    Currently in Pain?  Yes    Pain Score  5    last night was an 8   Pain Location  Neck    Pain Orientation  Mid;Lower    Pain Descriptors / Indicators  Aching    Pain Type  Acute pain;Chronic pain    Pain Radiating Towards  shoulders    Pain Onset  More than a month ago    Aggravating Factors   looking up or turning his head    Pain Relieving Factors  showers    Effect of Pain on Daily Activities  keeps going even with pain.                       [facility] Adult PT Treatment/Exercise - 04/20/20 0001      Exercises   Exercises  Neck      Neck Exercises: Seated   Cervical Isometrics  Extension;Right lateral flexion;Left lateral flexion;3 secs;5 reps    Neck Retraction  10  reps    Neck Retraction Limitations  scapular retraction x 10     W Back  10 reps    Other Seated Exercise  cervical and thoracic exersions x 3     Manual Therapy   Manual Therapy  Soft tissue mobilization;Manual Traction    Manual therapy comments  done seperate from all other treatment    Soft tissue mobilization  to decrease mm spasm    Manual Traction  to decrease pain and relieve pressure on nerve.             PT Education - 04/20/20 0907    Education Details  advance HEP    Person(s) Educated  Patient    Methods  Explanation;Handout;Verbal cues;Tactile cues;Demonstration    Comprehension  Verbalized understanding;Returned demonstration       PT Short Term Goals - 04/20/20 0914      PT SHORT TERM GOAL #1   Title  PT to be I in HEP to allow pain level of cervical area to be no greater than a 5/10    Time  2    Period  Weeks    Status  On-going    Target Date  04/28/20      PT SHORT TERM GOAL #2   Title  PT to be able to sleep for four hours straight  without waking due to cervical pain    Time  2    Period  Weeks    Status  On-going      PT SHORT TERM GOAL #3   Title  Pt to increase cervical rotation by 10 degrees both directions to allow safer driving    Time  2    Period  Weeks    Status  On-going        PT Long Term Goals - 04/20/20 0914      PT LONG TERM GOAL #1   Title  PT to be I in advanced HEP to allow pain in cervical area to be no greater than a 2/10    Time  4    Period  Weeks    Status  On-going      PT LONG TERM GOAL #2   Title  PT to be getting 6 or greater hours of sleep a night    Time  6    Period  Weeks    Status  On-going      PT LONG TERM GOAL #3   Title  Pt cervical ROM to be improved 20 degrees in both directions to improve safety of driving.    Time  4    Period  Weeks    Status  On-going      PT LONG TERM GOAL #4   Title  Pt cervical mm to be at least a 4+/5 to allow pt  to continue home improvements without increased cervical pain.    Time  4    Period  Weeks    Status  On-going            Plan - 04/20/20 0910    Clinical Impression Statement  Pt continues to have limited thoracic/cervical ROM with multiple spasms in B trap but spasms are smaller than upon intial evaluation.  Therapist suggested to pt that he may need to back off his physical working to allow healing, however, pt feels that he is under to many obligations.  PT with decreased sx and improve motion following treatment.    Examination-Activity Limitations  Reach Overhead;Other    Examination-Participation Restrictions  Other;Driving    Stability/Clinical Decision Making  Stable/Uncomplicated    Rehab Potential  Good    PT Frequency  3x / week    PT Duration  4 weeks    PT Treatment/Interventions  ADLs/Self Care Home Management;Traction;Moist Heat;Therapeutic exercise;Patient/family education;Manual techniques;Dry needling    PT Next Visit Plan  focus on movements/muscles for ROT. continue with manual . thoraicic  mobility (ROT, flexion/extension) and cervical retraction/protraction    PT Home Exercise Plan  supines cervical and scapular retraction; 5/3 CCK scapular protraction, towel roll down back and pec stretch at doorway;5/5:  cervical isometrics. cervical/thoracic excursions, sitting cervical and scapular retraction    Consulted and Agree with Plan of Care  Patient       Patient will benefit from skilled therapeutic intervention in order to improve the following deficits and impairments:  Decreased range of motion, Increased muscle spasms, Increased fascial restricitons, Pain, Decreased strength, Postural dysfunction  Visit Diagnosis: Cervicalgia     Problem List Patient Active Problem List   Diagnosis Date Noted  . FH: colon polyps 07/14/2019  . Hyperlipidemia LDL goal < 100 07/09/2013  . History of CVA (cerebrovascular accident) without residual deficits 07/09/2013  . PUD (peptic ulcer disease) 06/03/2012    Class: History of  . FH: colon cancer 06/03/2012  . GERD (gastroesophageal reflux disease) 06/03/2012  . Patent foramen ovale 05/21/2012  . MELENA 08/31/2010  . EPIGASTRIC PAIN 08/31/2010  . HYPERTENSION, BENIGN 07/04/2010  . CAD, NATIVE VESSEL 07/04/2010  . KNEE, ARTHRITIS, DEGEN./OSTEO 12/13/2009  . DERANGEMENT MENISCUS 12/13/2009  . SHOULDER PAIN 12/30/2008  . IMPINGEMENT SYNDROME 12/30/2008    Rayetta Humphrey, PT CLT [phone] 04/20/2020, 9:15 AM  [facility] [address] [place], [place], [address] Phone: [phone]   Fax:  [phone]  Name: Oshay Bjork MRN: [id number] Date of Birth: 1946/12/16

## 2020-04-21 ENCOUNTER — Ambulatory Visit (HOSPITAL_COMMUNITY): Payer: PPO | Admitting: Physical Therapy

## 2020-04-21 ENCOUNTER — Encounter (HOSPITAL_COMMUNITY): Payer: Self-pay | Admitting: Physical Therapy

## 2020-04-21 DIAGNOSIS — M5412 Radiculopathy, cervical region: Secondary | ICD-10-CM | POA: Diagnosis not present

## 2020-04-21 DIAGNOSIS — M542 Cervicalgia: Secondary | ICD-10-CM

## 2020-04-21 NOTE — Patient Instructions (Signed)
Access Code: [email or web address] URL: [email or web address] Date: 04/21/2020 Prepared by: Mitzi Hansen Cray Monnin  Exercises Seated Assisted Cervical Rotation with Towel - 1 x daily - 7 x weekly - 2 sets - 10 reps

## 2020-04-21 NOTE — Therapy (Signed)
[name] [name] [address] [place], [place], [address] Phone: [phone]   Fax:  [phone]  Physical Therapy Treatment  Patient Details  Name: [name] [name] MRN: [id number] Date of Birth: [birth year] Referring Provider (PT): Kary Kos   Encounter Date: 04/21/2020  PT End of Session - 04/21/20 1028    Visit Number  4    Number of Visits  12    Date for PT Re-Evaluation  05/14/20    Progress Note Due on Visit  10    PT Start Time  0947    PT Stop Time  1030    PT Time Calculation (min)  43 min    Activity Tolerance  Patient tolerated treatment well    Behavior During Therapy  [facility] for tasks assessed/performed       Past Medical History:  Diagnosis Date  . BPH (benign prostatic hyperplasia)   . CAD (coronary artery disease)    nonobstructive  . Cholesterol depletion   . CVA (cerebral vascular accident) ([place]) 2007  . HTN (hypertension)   . Patent foramen ovale    on plavix, followed by Dr. Jenell Milliner    Past Surgical History:  Procedure Laterality Date  . COLONOSCOPY  2003   hemorrhoids,otherwise normal.   . COLONOSCOPY  09/2010   internal hemorrhoids, next TCS in 09/2015  . COLONOSCOPY N/A 07/21/2019   Procedure: COLONOSCOPY;  Surgeon: Daneil Dolin, MD;  [facility];  Service: Endoscopy;  Laterality: N/A;  1:15pm-moved up to 12:00 per Mindy  . ESOPHAGOGASTRODUODENOSCOPY  02/26/11   esophageal erosions consistent with mild relfux/small HH, antral erosions bx mild chronic gastritis and NO h.pylori  . ESOPHAGOGASTRODUODENOSCOPY  09/2010   schatzki ring not manipulated. gastric ulcer with satellite erosions  . GALLBLADDER SURGERY    . INGUINAL HERNIA REPAIR  2006   left    There were no vitals filed for this visit.  Subjective Assessment - 04/21/20 0948    Subjective  Patient states his neck is pretty good. Today is a good day for his necks. His neck is getting better but sleeping has been an issue.    Pertinent History  OA, HTN, CVA    Limitations  House hold activities    How long can you sit comfortably?  n    Patient Stated Goals  To be able to sleep better, to be able to turn his head better and to be able to get back to working on his house.    Pain Score  3     Pain Location  Neck    Pain Onset  More than a month ago                       [facility] Adult PT Treatment/Exercise - 04/21/20 0001      Neck Exercises: Seated   Other Seated Exercise  thoracic ext over chair 2x10 5 second hold    Other Seated Exercise  SNAG for R rotation 2x10      Manual Therapy   Manual Therapy  Joint mobilization;Soft tissue mobilization;Manual Traction    Manual therapy comments  done seperate from all other treatment    Joint Mobilization  R and L UPA in neutral and positioned in r and L rotation c4-c7; manual cervical retraction 1x10, retraction extension 1x10    Soft tissue mobilization  cervical paraspinals    Manual Traction  3x 60 seconds  PT Education - 04/21/20 0953    Education Details  Patient educated on continuing HEP    Person(s) Educated  Patient    Methods  Explanation;Demonstration    Comprehension  Verbalized understanding;Returned demonstration       PT Short Term Goals - 04/20/20 0914      PT SHORT TERM GOAL #1   Title  PT to be I in HEP to allow pain level of cervical area to be no greater than a 5/10    Time  2    Period  Weeks    Status  On-going    Target Date  04/28/20      PT SHORT TERM GOAL #2   Title  PT to be able to sleep for four hours straight without waking due to cervical pain    Time  2    Period  Weeks    Status  On-going      PT SHORT TERM GOAL #3   Title  Pt to increase cervical rotation by 10 degrees both directions to allow safer driving    Time  2    Period  Weeks    Status  On-going        PT Long Term Goals - 04/20/20 0914      PT LONG TERM GOAL #1   Title  PT to be I in advanced HEP to allow pain in  cervical area to be no greater than a 2/10    Time  4    Period  Weeks    Status  On-going      PT LONG TERM GOAL #2   Title  PT to be getting 6 or greater hours of sleep a night    Time  6    Period  Weeks    Status  On-going      PT LONG TERM GOAL #3   Title  Pt cervical ROM to be improved 20 degrees in both directions to improve safety of driving.    Time  4    Period  Weeks    Status  On-going      PT LONG TERM GOAL #4   Title  Pt cervical mm to be at least a 4+/5 to allow pt  to continue home improvements without increased cervical pain.    Time  4    Period  Weeks    Status  On-going            Plan - 04/21/20 1029    Clinical Impression Statement  Patient tolerates manual therapy well and stiffness improves following mobilizations. He tolerates manual retractions well with frequent cueing for relaxation. He is able to progress to manual retraction extensions and notes decrease in neck pain to 1/10 following. He continues to demonstrate limited R rotation following manual therapy. He tolerates SNAGs for R rotation well without increase in symptoms and he completes with minimal cueing for positioning and mechanics.  He demonstrates improved R rotation ROM following. Patient will continue to benefit from skilled physical therapy in order to reduce impairment and improve function.    Examination-Activity Limitations  Reach Overhead;Other    Examination-Participation Restrictions  Other;Driving    Stability/Clinical Decision Making  Stable/Uncomplicated    Rehab Potential  Good    PT Frequency  3x / week    PT Duration  4 weeks    PT Treatment/Interventions  ADLs/Self Care Home Management;Traction;Moist Heat;Therapeutic exercise;Patient/family education;Manual techniques;Dry needling    PT Next Visit Plan  focus on movements/muscles for ROT. continue with manual . thoraicic mobility (ROT, flexion/extension) and cervical retraction/protraction, continue SNAG exercise    PT  Home Exercise Plan  supines cervical and scapular retraction; 5/3 CCK scapular protraction, towel roll down back and pec stretch at doorway;5/5:  cervical isometrics. cervical/thoracic excursions, sitting cervical and scapular retraction 04/21/20 SNAG R rotation    Consulted and Agree with Plan of Care  Patient       Patient will benefit from skilled therapeutic intervention in order to improve the following deficits and impairments:  Decreased range of motion, Increased muscle spasms, Increased fascial restricitons, Pain, Decreased strength, Postural dysfunction  Visit Diagnosis: Cervicalgia  Radiculopathy, cervical region     Problem List Patient Active Problem List   Diagnosis Date Noted  . FH: colon polyps 07/14/2019  . Hyperlipidemia LDL goal < 100 07/09/2013  . History of CVA (cerebrovascular accident) without residual deficits 07/09/2013  . PUD (peptic ulcer disease) 06/03/2012    Class: History of  . FH: colon cancer 06/03/2012  . GERD (gastroesophageal reflux disease) 06/03/2012  . Patent foramen ovale 05/21/2012  . MELENA 08/31/2010  . EPIGASTRIC PAIN 08/31/2010  . HYPERTENSION, BENIGN 07/04/2010  . CAD, NATIVE VESSEL 07/04/2010  . KNEE, ARTHRITIS, DEGEN./OSTEO 12/13/2009  . DERANGEMENT MENISCUS 12/13/2009  . SHOULDER PAIN 12/30/2008  . IMPINGEMENT SYNDROME 12/30/2008    10:33 AM, 04/21/20 Mearl Latin PT, DPT Physical Therapist at [facility] [place], [place], [address] Phone: [phone]   Fax:  [phone]  Name: [name] [name] MRN: [id number] Date of Birth: 05/12/1946

## 2020-04-25 ENCOUNTER — Ambulatory Visit (HOSPITAL_COMMUNITY): Payer: PPO | Admitting: Physical Therapy

## 2020-04-25 ENCOUNTER — Other Ambulatory Visit: Payer: Self-pay

## 2020-04-25 DIAGNOSIS — M5412 Radiculopathy, cervical region: Secondary | ICD-10-CM

## 2020-04-25 DIAGNOSIS — M542 Cervicalgia: Secondary | ICD-10-CM

## 2020-04-25 NOTE — Therapy (Signed)
[place], [place], [address] Phone: [phone]   Fax:  [phone]  Physical Therapy Treatment  Patient Details  Name: [name] [name] MRN: [id number] Date of Birth: 01-20-1946 Referring Provider (PT): Kary Kos   Encounter Date: 04/25/2020  PT End of Session - 04/25/20 0919    Visit Number  5    Number of Visits  12    Date for PT Re-Evaluation  05/14/20    Progress Note Due on Visit  10    PT Start Time  0831    PT Stop Time  0910    PT Time Calculation (min)  39 min    Activity Tolerance  Patient tolerated treatment well    Behavior During Therapy  [facility] for tasks assessed/performed       Past Medical History:  Diagnosis Date  . BPH (benign prostatic hyperplasia)   . CAD (coronary artery disease)    nonobstructive  . Cholesterol depletion   . CVA (cerebral vascular accident) ([place]) 2007  . HTN (hypertension)   . Patent foramen ovale    on plavix, followed by Dr. Jenell Milliner    Past Surgical History:  Procedure Laterality Date  . COLONOSCOPY  2003   hemorrhoids,otherwise normal.   . COLONOSCOPY  09/2010   internal hemorrhoids, next TCS in 09/2015  . COLONOSCOPY N/A 07/21/2019   Procedure: COLONOSCOPY;  Surgeon: Daneil Dolin, MD;  [facility];  Service: Endoscopy;  Laterality: N/A;  1:15pm-moved up to 12:00 per Mindy  . ESOPHAGOGASTRODUODENOSCOPY  02/26/11   esophageal erosions consistent with mild relfux/small HH, antral erosions bx mild chronic gastritis and NO h.pylori  . ESOPHAGOGASTRODUODENOSCOPY  09/2010   schatzki ring not manipulated. gastric ulcer with satellite erosions  . GALLBLADDER SURGERY    . INGUINAL HERNIA REPAIR  2006   left    There were no vitals filed for this visit.  Subjective Assessment - 04/25/20 0841    Subjective  pt states he was at his grandsons ball game all weekend and that bothered his neck by the end of each day.  states he has a hard time sleeping.   States it is currently a 2/10    Currently in Pain?  Yes    Pain Score  2     Pain Location  Neck    Pain Descriptors / Indicators  Aching    Pain Type  Acute pain                       [facility] Adult PT Treatment/Exercise - 04/25/20 0001      Neck Exercises: Standing   Other Standing Exercises  corner stretch 3X20"       Neck Exercises: Seated   Cervical Isometrics  Extension;Right lateral flexion;Left lateral flexion;3 secs;5 reps    Neck Retraction  10 reps    Neck Retraction Limitations  scapular retraction x 10     Other Seated Exercise  thoracic excursions with UE movements 5 reps each      Manual Therapy   Manual Therapy  Soft tissue mobilization;Manual Traction    Manual therapy comments  done seperate from all other treatment    Soft tissue mobilization  Rt cervical and upper trap in seated    Manual Traction  3x 60 seconds with occipital release between each set             PT Education - 04/25/20 [id number]  Education Details  possible trial of contoured pillow to increase comfort with side sleeping.    Person(s) Educated  Patient    Methods  Explanation    Comprehension  Verbalized understanding       PT Short Term Goals - 04/20/20 0914      PT SHORT TERM GOAL #1   Title  PT to be I in HEP to allow pain level of cervical area to be no greater than a 5/10    Time  2    Period  Weeks    Status  On-going    Target Date  04/28/20      PT SHORT TERM GOAL #2   Title  PT to be able to sleep for four hours straight without waking due to cervical pain    Time  2    Period  Weeks    Status  On-going      PT SHORT TERM GOAL #3   Title  Pt to increase cervical rotation by 10 degrees both directions to allow safer driving    Time  2    Period  Weeks    Status  On-going        PT Long Term Goals - 04/20/20 0914      PT LONG TERM GOAL #1   Title  PT to be I in advanced HEP to allow pain in cervical area to be no greater than a 2/10    Time  4     Period  Weeks    Status  On-going      PT LONG TERM GOAL #2   Title  PT to be getting 6 or greater hours of sleep a night    Time  6    Period  Weeks    Status  On-going      PT LONG TERM GOAL #3   Title  Pt cervical ROM to be improved 20 degrees in both directions to improve safety of driving.    Time  4    Period  Weeks    Status  On-going      PT LONG TERM GOAL #4   Title  Pt cervical mm to be at least a 4+/5 to allow pt  to continue home improvements without increased cervical pain.    Time  4    Period  Weeks    Status  On-going            Plan - 04/25/20 0920    Clinical Impression Statement  Continued with cervical AROM, postrual strengthening, stretches and manual technqiues to improve cervical pain and dysfunction.  Pt with normal cervical rotation today and without complaints of pain.  Largest complaint is at end of the day and nightime so may have postural weakness aggrevating symptoms.  Multiple Large spasm in Rt Upper trap and scap region reduced but not resolved with manual.  Tight occiptial mm with extended time to release. .  overall improvement at end of session.    Examination-Activity Limitations  Reach Overhead;Other    Examination-Participation Restrictions  Other;Driving    Stability/Clinical Decision Making  Stable/Uncomplicated    Rehab Potential  Good    PT Frequency  3x / week    PT Duration  4 weeks    PT Treatment/Interventions  ADLs/Self Care Home Management;Traction;Moist Heat;Therapeutic exercise;Patient/family education;Manual techniques;Dry needling    PT Next Visit Plan  focus on movements/muscles for ROT. continue with manual . thoraicic mobility (ROT, flexion/extension) and cervical retraction/protraction,  continue SNAG exercise    PT Home Exercise Plan  supines cervical and scapular retraction; 5/3 CCK scapular protraction, towel roll down back and pec stretch at doorway;5/5:  cervical isometrics. cervical/thoracic excursions, sitting  cervical and scapular retraction 04/21/20 SNAG R rotation    Consulted and Agree with Plan of Care  Patient       Patient will benefit from skilled therapeutic intervention in order to improve the following deficits and impairments:  Decreased range of motion, Increased muscle spasms, Increased fascial restricitons, Pain, Decreased strength, Postural dysfunction  Visit Diagnosis: Cervicalgia  Radiculopathy, cervical region     Problem List Patient Active Problem List   Diagnosis Date Noted  . FH: colon polyps 07/14/2019  . Hyperlipidemia LDL goal < 100 07/09/2013  . History of CVA (cerebrovascular accident) without residual deficits 07/09/2013  . PUD (peptic ulcer disease) 06/03/2012    Class: History of  . FH: colon cancer 06/03/2012  . GERD (gastroesophageal reflux disease) 06/03/2012  . Patent foramen ovale 05/21/2012  . MELENA 08/31/2010  . EPIGASTRIC PAIN 08/31/2010  . HYPERTENSION, BENIGN 07/04/2010  . CAD, NATIVE VESSEL 07/04/2010  . KNEE, ARTHRITIS, DEGEN./OSTEO 12/13/2009  . DERANGEMENT MENISCUS 12/13/2009  . SHOULDER PAIN 12/30/2008  . IMPINGEMENT SYNDROME 12/30/2008   Teena Irani, PTA/CLT [phone]  Teena Irani 04/25/2020, 9:23 AM  [facility] [address] [place], [place], [address] Phone: [phone]   Fax:  [phone]  Name: [name] [name] MRN: [id number] Date of Birth: 08-Oct-1946

## 2020-04-27 ENCOUNTER — Ambulatory Visit (HOSPITAL_COMMUNITY): Payer: PPO | Admitting: Physical Therapy

## 2020-04-27 ENCOUNTER — Other Ambulatory Visit: Payer: Self-pay

## 2020-04-27 ENCOUNTER — Encounter (HOSPITAL_COMMUNITY): Payer: Self-pay | Admitting: Physical Therapy

## 2020-04-27 DIAGNOSIS — M542 Cervicalgia: Secondary | ICD-10-CM

## 2020-04-27 DIAGNOSIS — M5412 Radiculopathy, cervical region: Secondary | ICD-10-CM | POA: Diagnosis not present

## 2020-04-27 NOTE — Therapy (Signed)
[facility] [address] [place], [place], [address] Phone: [phone]   Fax:  [phone]  Physical Therapy Treatment  Patient Details  Name: [name] [name] MRN: [id number] Date of Birth: 20-Sep-1946 Referring Provider (PT): Kary Kos   Encounter Date: 04/27/2020  PT End of Session - 04/27/20 0842    Visit Number  6    Number of Visits  12    Date for PT Re-Evaluation  05/14/20    Progress Note Due on Visit  10    PT Start Time  0831    PT Stop Time  0910    PT Time Calculation (min)  39 min    Activity Tolerance  Patient tolerated treatment well    Behavior During Therapy  [facility] for tasks assessed/performed       Past Medical History:  Diagnosis Date  . BPH (benign prostatic hyperplasia)   . CAD (coronary artery disease)    nonobstructive  . Cholesterol depletion   . CVA (cerebral vascular accident) ([place]) 2007  . HTN (hypertension)   . Patent foramen ovale    on plavix, followed by Dr. Jenell Milliner    Past Surgical History:  Procedure Laterality Date  . COLONOSCOPY  2003   hemorrhoids,otherwise normal.   . COLONOSCOPY  09/2010   internal hemorrhoids, next TCS in 09/2015  . COLONOSCOPY N/A 07/21/2019   Procedure: COLONOSCOPY;  Surgeon: Daneil Dolin, MD;  [facility];  Service: Endoscopy;  Laterality: N/A;  1:15pm-moved up to 12:00 per Mindy  . ESOPHAGOGASTRODUODENOSCOPY  02/26/11   esophageal erosions consistent with mild relfux/small HH, antral erosions bx mild chronic gastritis and NO h.pylori  . ESOPHAGOGASTRODUODENOSCOPY  09/2010   schatzki ring not manipulated. gastric ulcer with satellite erosions  . GALLBLADDER SURGERY    . INGUINAL HERNIA REPAIR  2006   left    There were no vitals filed for this visit.  Subjective Assessment - 04/27/20 0828    Subjective  PT states that he has good and bad days.  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    Pertinent History  OA, HTN, CVA    Limitations  House hold activities    How long can you sit comfortably?  n    Patient Stated Goals  To be able to sleep better, to be able to turn his head better and to be able to get back to working on his house.    Currently in Pain?  Yes    Pain Score  2     Pain Location  Neck    Pain Orientation  Mid;Lower    Pain Descriptors / Indicators  Aching    Pain Type  Acute pain    Pain Onset  More than a month ago    Pain Frequency  Constant    Aggravating Factors   looking up    Pain Relieving Factors  hot showers    Effect of Pain on Daily Activities  keeps going                       [facility] Adult PT Treatment/Exercise - 04/27/20 0001      Exercises   Exercises  Neck      Neck Exercises: Standing   Other Standing Exercises  --      Neck Exercises: Seated   Cervical Isometrics  Extension;Right lateral flexion;Left lateral flexion;3 secs;5 reps    Neck Retraction  10 reps    Neck Retraction Limitations  --    W Back  10 reps    Shoulder Shrugs  5 reps    Other Seated Exercise  cervical and thoracic excursions with UE movements 5 reps each    Other Seated Exercise  Snag for extension       Manual Therapy   Manual Therapy  Soft tissue mobilization;Manual Traction    Manual therapy comments  done seperate from all other treatment    Soft tissue mobilization  Rt cervical and upper trap in seated    Manual Traction  3x 60 seconds with occipital release between each set      Neck Exercises: Stretches   Upper Trapezius Stretch  Right;Left;3 reps;30 seconds               PT Short Term Goals - 04/20/20 0914      PT SHORT TERM GOAL #1   Title  PT to be I in HEP to allow pain level of cervical area to be no greater than a 5/10    Time  2    Period  Weeks    Status  On-going    Target Date  04/28/20      PT SHORT TERM GOAL #2   Title  PT to be able to sleep for four hours straight without waking due to  cervical pain    Time  2    Period  Weeks    Status  On-going      PT SHORT TERM GOAL #3   Title  Pt to increase cervical rotation by 10 degrees both directions to allow safer driving    Time  2    Period  Weeks    Status  On-going        PT Long Term Goals - 04/20/20 [id number]      PT LONG TERM GOAL #1   Title  PT to be I in advanced HEP to allow pain in cervical area to be no greater than a 2/10    Time  4    Period  Weeks    Status  On-going      PT LONG TERM GOAL #2   Title  PT to be getting 6 or greater hours of sleep a night    Time  6    Period  Weeks    Status  On-going      PT LONG TERM GOAL #3   Title  Pt cervical ROM to be improved 20 degrees in both directions to improve safety of driving.    Time  4    Period  Weeks    Status  On-going      PT LONG TERM GOAL #4   Title  Pt cervical mm to be at least a 4+/5 to allow pt  to continue home improvements without increased cervical pain.    Time  4    Period  Weeks    Status  On-going            Plan - 04/27/20 [id number]    Clinical Impression Statement  Added upper trap stretch, shrugs, and SNAG for extension to program to promote improved motion.  Pt highest pain in past 24 hr =4/10.  PT ROM improving but will benefit from continued skilled PT to decrease pain and return to full ROM    Examination-Activity Limitations  Reach  Overhead;Other    Examination-Participation Restrictions  Other;Driving    Stability/Clinical Decision Making  Stable/Uncomplicated    Rehab Potential  Good    PT Frequency  3x / week    PT Duration  4 weeks    PT Treatment/Interventions  ADLs/Self Care Home Management;Traction;Moist Heat;Therapeutic exercise;Patient/family education;Manual techniques;Dry needling    PT Next Visit Plan  focus on movements/muscles for ROT. continue with manual . thoraicic mobility (ROT, flexion/extension) and cervical retraction/protraction, continue SNAG exercise    PT Home Exercise Plan  supines cervical  and scapular retraction; 5/3 CCK scapular protraction, towel roll down back and pec stretch at doorway;5/5:  cervical isometrics. cervical/thoracic excursions, sitting cervical and scapular retraction 04/21/20 SNAG R rotation    Consulted and Agree with Plan of Care  Patient       Patient will benefit from skilled therapeutic intervention in order to improve the following deficits and impairments:  Decreased range of motion, Increased muscle spasms, Increased fascial restricitons, Pain, Decreased strength, Postural dysfunction  Visit Diagnosis: Cervicalgia  Radiculopathy, cervical region     Problem List Patient Active Problem List   Diagnosis Date Noted  . FH: colon polyps 07/14/2019  . Hyperlipidemia LDL goal < 100 07/09/2013  . History of CVA (cerebrovascular accident) without residual deficits 07/09/2013  . PUD (peptic ulcer disease) 06/03/2012    Class: History of  . FH: colon cancer 06/03/2012  . GERD (gastroesophageal reflux disease) 06/03/2012  . Patent foramen ovale 05/21/2012  . MELENA 08/31/2010  . EPIGASTRIC PAIN 08/31/2010  . HYPERTENSION, BENIGN 07/04/2010  . CAD, NATIVE VESSEL 07/04/2010  . KNEE, ARTHRITIS, DEGEN./OSTEO 12/13/2009  . DERANGEMENT MENISCUS 12/13/2009  . SHOULDER PAIN 12/30/2008  . IMPINGEMENT SYNDROME 12/30/2008    Rayetta Humphrey, PT CLT [phone] 04/27/2020, 9:13 AM  [facility] [address] [place], [place], [address] Phone: [phone]   Fax:  [phone]  Name: Gerritt Bignell MRN: [id number] Date of Birth: 1946-10-11

## 2020-04-29 ENCOUNTER — Other Ambulatory Visit: Payer: Self-pay

## 2020-04-29 ENCOUNTER — Ambulatory Visit (HOSPITAL_COMMUNITY): Payer: PPO | Admitting: Physical Therapy

## 2020-04-29 ENCOUNTER — Encounter (HOSPITAL_COMMUNITY): Payer: Self-pay | Admitting: Physical Therapy

## 2020-04-29 DIAGNOSIS — M5412 Radiculopathy, cervical region: Secondary | ICD-10-CM | POA: Diagnosis not present

## 2020-04-29 DIAGNOSIS — M542 Cervicalgia: Secondary | ICD-10-CM

## 2020-04-29 NOTE — Therapy (Signed)
[facility] [address] [place], [place], [address] Phone: [phone]   Fax:  [phone]  Physical Therapy Treatment  Patient Details  Name: [name] [name] MRN: [id number] Date of Birth: 05-Sep-1946 Referring Provider (PT): Kary Kos   Encounter Date: 04/29/2020  PT End of Session - 04/29/20 0824    Visit Number  7    Number of Visits  12    Date for PT Re-Evaluation  05/14/20    Progress Note Due on Visit  10    PT Start Time  0817    PT Stop Time  0855    PT Time Calculation (min)  38 min    Activity Tolerance  Patient tolerated treatment well    Behavior During Therapy  [facility] for tasks assessed/performed       Past Medical History:  Diagnosis Date  . BPH (benign prostatic hyperplasia)   . CAD (coronary artery disease)    nonobstructive  . Cholesterol depletion   . CVA (cerebral vascular accident) ([place]) 2007  . HTN (hypertension)   . Patent foramen ovale    on plavix, followed by Dr. Jenell Milliner    Past Surgical History:  Procedure Laterality Date  . COLONOSCOPY  2003   hemorrhoids,otherwise normal.   . COLONOSCOPY  09/2010   internal hemorrhoids, next TCS in 09/2015  . COLONOSCOPY N/A 07/21/2019   Procedure: COLONOSCOPY;  Surgeon: Daneil Dolin, MD;  Location[facility];  Service: Endoscopy;  Laterality: N/A;  1:15pm-moved up to 12:00 per Mindy  . ESOPHAGOGASTRODUODENOSCOPY  02/26/11   esophageal erosions consistent with mild relfux/small HH, antral erosions bx mild chronic gastritis and NO h.pylori  . ESOPHAGOGASTRODUODENOSCOPY  09/2010   schatzki ring not manipulated. gastric ulcer with satellite erosions  . GALLBLADDER SURGERY    . INGUINAL HERNIA REPAIR  2006   left    There were no vitals filed for this visit.  Subjective Assessment - 04/29/20 0819    Subjective  PT reported that he is having 3/10 pain currently and that it is in the middle of his neck, not more on one side than the other.    Pertinent  History  OA, HTN, CVA    Limitations  House hold activities    How long can you sit comfortably?  --    Patient Stated Goals  To be able to sleep better, to be able to turn his head better and to be able to get back to working on his house.    Currently in Pain?  Yes    Pain Score  3     Pain Location  Neck    Pain Orientation  Mid;Lower    Pain Descriptors / Indicators  Aching    Pain Onset  More than a month ago                        [facility] Adult PT Treatment/Exercise - 04/29/20 0001      Neck Exercises: Seated   Cervical Isometrics  Extension;Right lateral flexion;Left lateral flexion;5 reps;5 secs    Neck Retraction  10 reps    W Back  10 reps    Shoulder Shrugs  10 reps    Other Seated Exercise  cervical and thoracic excursions with UE movements 5 reps each    Other Seated Exercise  Snag for extension: towel for simultaneous pull with cervical extension x 5      Manual Therapy  Manual Therapy  Soft tissue mobilization;Joint mobilization    Manual therapy comments  done seperate from all other treatment    Joint Mobilization  Grade III CPAs and Transverse processes T6-T12 for improved mobility     Soft tissue mobilization  To Cervical paraspinals and periscapular muscles      Neck Exercises: Stretches   Upper Trapezius Stretch  Right;Left;3 reps;30 seconds               PT Short Term Goals - 04/20/20 0914      PT SHORT TERM GOAL #1   Title  PT to be I in HEP to allow pain level of cervical area to be no greater than a 5/10    Time  2    Period  Weeks    Status  On-going    Target Date  04/28/20      PT SHORT TERM GOAL #2   Title  PT to be able to sleep for four hours straight without waking due to cervical pain    Time  2    Period  Weeks    Status  On-going      PT SHORT TERM GOAL #3   Title  Pt to increase cervical rotation by 10 degrees both directions to allow safer driving    Time  2    Period  Weeks    Status  On-going         PT Long Term Goals - 04/20/20 0914      PT LONG TERM GOAL #1   Title  PT to be I in advanced HEP to allow pain in cervical area to be no greater than a 2/10    Time  4    Period  Weeks    Status  On-going      PT LONG TERM GOAL #2   Title  PT to be getting 6 or greater hours of sleep a night    Time  6    Period  Weeks    Status  On-going      PT LONG TERM GOAL #3   Title  Pt cervical ROM to be improved 20 degrees in both directions to improve safety of driving.    Time  4    Period  Weeks    Status  On-going      PT LONG TERM GOAL #4   Title  Pt cervical mm to be at least a 4+/5 to allow pt  to continue home improvements without increased cervical pain.    Time  4    Period  Weeks    Status  On-going            Plan - 04/29/20 0900    Clinical Impression Statement  Continued with exercises to improve cervical and thoracic mobility as well as cervical stabilization exercises. Noted hypomobility of thoracic spine this session. Performed PAs of the thoracic spine to improve mobility. Patient reported feeling good at the end of the session, no increased pain.    Examination-Activity Limitations  Reach Overhead;Other    Examination-Participation Restrictions  Other;Driving    Stability/Clinical Decision Making  Stable/Uncomplicated    Rehab Potential  Good    PT Frequency  3x / week    PT Duration  4 weeks    PT Treatment/Interventions  ADLs/Self Care Home Management;Traction;Moist Heat;Therapeutic exercise;Patient/family education;Manual techniques;Dry needling    PT Next Visit Plan  F/U on effectiveness of thoracic spine mobs and continue if positive  response. focus on movements/muscles for ROT. continue with manual . thoraicic mobility (ROT, flexion/extension) and cervical retraction/protraction, continue SNAG exercise    PT Home Exercise Plan  supines cervical and scapular retraction; 5/3 CCK scapular protraction, towel roll down back and pec stretch at doorway;5/5:   cervical isometrics. cervical/thoracic excursions, sitting cervical and scapular retraction 04/21/20 SNAG R rotation    Consulted and Agree with Plan of Care  Patient       Patient will benefit from skilled therapeutic intervention in order to improve the following deficits and impairments:  Decreased range of motion, Increased muscle spasms, Increased fascial restricitons, Pain, Decreased strength, Postural dysfunction  Visit Diagnosis: Cervicalgia  Radiculopathy, cervical region     Problem List Patient Active Problem List   Diagnosis Date Noted  . FH: colon polyps 07/14/2019  . Hyperlipidemia LDL goal < 100 07/09/2013  . History of CVA (cerebrovascular accident) without residual deficits 07/09/2013  . PUD (peptic ulcer disease) 06/03/2012    Class: History of  . FH: colon cancer 06/03/2012  . GERD (gastroesophageal reflux disease) 06/03/2012  . Patent foramen ovale 05/21/2012  . MELENA 08/31/2010  . EPIGASTRIC PAIN 08/31/2010  . HYPERTENSION, BENIGN 07/04/2010  . CAD, NATIVE VESSEL 07/04/2010  . KNEE, ARTHRITIS, DEGEN./OSTEO 12/13/2009  . DERANGEMENT MENISCUS 12/13/2009  . SHOULDER PAIN 12/30/2008  . IMPINGEMENT SYNDROME 12/30/2008   Clarene Critchley PT, DPT 9:02 AM, 04/29/20 [facility] [address] [place], [place], [address] Phone: [phone]   Fax:  [phone]  Name: Torien Emmett MRN: [id number] Date of Birth: 12-21-1945

## 2020-05-02 ENCOUNTER — Ambulatory Visit (HOSPITAL_COMMUNITY): Payer: PPO | Admitting: Physical Therapy

## 2020-05-02 ENCOUNTER — Telehealth (HOSPITAL_COMMUNITY): Payer: Self-pay | Admitting: Physical Therapy

## 2020-05-02 NOTE — Telephone Encounter (Signed)
He has vertigo today and will not be here

## 2020-05-04 ENCOUNTER — Ambulatory Visit (HOSPITAL_COMMUNITY): Payer: PPO | Admitting: Physical Therapy

## 2020-05-04 ENCOUNTER — Other Ambulatory Visit: Payer: Self-pay

## 2020-05-04 ENCOUNTER — Encounter (HOSPITAL_COMMUNITY): Payer: Self-pay | Admitting: Physical Therapy

## 2020-05-04 DIAGNOSIS — M5412 Radiculopathy, cervical region: Secondary | ICD-10-CM | POA: Diagnosis not present

## 2020-05-04 DIAGNOSIS — M542 Cervicalgia: Secondary | ICD-10-CM

## 2020-05-04 NOTE — Therapy (Signed)
[place], [place], [address] Phone: [phone]   Fax:  [phone]  Physical Therapy Treatment  Patient Details  Name: [name] [name] MRN: [id number] Date of Birth: [birth year] Referring Provider (PT): Kary Kos   Encounter Date: 05/04/2020  PT End of Session - 05/04/20 0819    Visit Number  8    Number of Visits  12    Date for PT Re-Evaluation  05/14/20    Progress Note Due on Visit  10    PT Start Time  0813    PT Stop Time  0851    PT Time Calculation (min)  38 min    Activity Tolerance  Patient tolerated treatment well    Behavior During Therapy  [facility] for tasks assessed/performed       Past Medical History:  Diagnosis Date  . BPH (benign prostatic hyperplasia)   . CAD (coronary artery disease)    nonobstructive  . Cholesterol depletion   . CVA (cerebral vascular accident) ([place]) 2007  . HTN (hypertension)   . Patent foramen ovale    on plavix, followed by Dr. Jenell Milliner    Past Surgical History:  Procedure Laterality Date  . COLONOSCOPY  2003   hemorrhoids,otherwise normal.   . COLONOSCOPY  09/2010   internal hemorrhoids, next TCS in 09/2015  . COLONOSCOPY N/A 07/21/2019   Procedure: COLONOSCOPY;  Surgeon: Daneil Dolin, MD;  [facility];  Service: Endoscopy;  Laterality: N/A;  1:15pm-moved up to 12:00 per Mindy  . ESOPHAGOGASTRODUODENOSCOPY  02/26/11   esophageal erosions consistent with mild relfux/small HH, antral erosions bx mild chronic gastritis and NO h.pylori  . ESOPHAGOGASTRODUODENOSCOPY  09/2010   schatzki ring not manipulated. gastric ulcer with satellite erosions  . GALLBLADDER SURGERY    . INGUINAL HERNIA REPAIR  2006   left    There were no vitals filed for this visit.  Subjective Assessment - 05/04/20 0817    Subjective  Patient reported 3/10 in his neck pain currently. Reported that he has had vertigo for the past several days. He stated he is taking medicine for  the vertigo and this is helping. Patient reported that the vertigo does not change with position and occurs for many hours at a time.    Pertinent History  OA, HTN, CVA    Limitations  House hold activities    Patient Stated Goals  To be able to sleep better, to be able to turn his head better and to be able to get back to working on his house.    Currently in Pain?  Yes    Pain Score  3     Pain Location  Neck    Pain Descriptors / Indicators  Aching    Pain Onset  More than a month ago                        [facility] Adult PT Treatment/Exercise - 05/04/20 0001      Neck Exercises: Theraband   Shoulder Extension  15 reps;Red    Rows  15 reps;Red      Neck Exercises: Standing   Other Standing Exercises  Scaption bil shoulder flexion 2# weight x15. Focusing on decreasing excessive scapular elevation and decreasing upper trap activation.     Other Standing Exercises  Y liftoff x15 demonstration for form      Neck Exercises: Seated   Cervical Isometrics  Extension;Right lateral flexion;Left  lateral flexion;5 reps;5 secs    Neck Retraction  10 reps    W Back  10 reps    Shoulder Shrugs  10 reps      Manual Therapy   Manual Therapy  Soft tissue mobilization;Joint mobilization    Manual therapy comments  done seperate from all other treatment    Joint Mobilization  Grade III CPAs and Transverse processes T6-T12 for improved mobility     Soft tissue mobilization  To Cervical paraspinals and periscapular muscles               PT Short Term Goals - 04/20/20 0914      PT SHORT TERM GOAL #1   Title  PT to be I in HEP to allow pain level of cervical area to be no greater than a 5/10    Time  2    Period  Weeks    Status  On-going    Target Date  04/28/20      PT SHORT TERM GOAL #2   Title  PT to be able to sleep for four hours straight without waking due to cervical pain    Time  2    Period  Weeks    Status  On-going      PT SHORT TERM GOAL #3   Title  Pt  to increase cervical rotation by 10 degrees both directions to allow safer driving    Time  2    Period  Weeks    Status  On-going        PT Long Term Goals - 04/20/20 0914      PT LONG TERM GOAL #1   Title  PT to be I in advanced HEP to allow pain in cervical area to be no greater than a 2/10    Time  4    Period  Weeks    Status  On-going      PT LONG TERM GOAL #2   Title  PT to be getting 6 or greater hours of sleep a night    Time  6    Period  Weeks    Status  On-going      PT LONG TERM GOAL #3   Title  Pt cervical ROM to be improved 20 degrees in both directions to improve safety of driving.    Time  4    Period  Weeks    Status  On-going      PT LONG TERM GOAL #4   Title  Pt cervical mm to be at least a 4+/5 to allow pt  to continue home improvements without increased cervical pain.    Time  4    Period  Weeks    Status  On-going            Plan - 05/04/20 0856    Clinical Impression Statement  Patient reported a having had vertigo over the weekend, but reported feeling better now. Discussed with patient that if this persists to let therapists know because it could be addressed in the future with PT if needed. Focused on postural strengthening and coordination of movement with overhead exercises this session. Added Y liftoff this session as well. Continued with manual therapy to continue working on improving patient's mobility.    Examination-Activity Limitations  Reach Overhead;Other    Examination-Participation Restrictions  Other;Driving    Stability/Clinical Decision Making  Stable/Uncomplicated    Rehab Potential  Good    PT Frequency  3x /  week    PT Duration  4 weeks    PT Treatment/Interventions  ADLs/Self Care Home Management;Traction;Moist Heat;Therapeutic exercise;Patient/family education;Manual techniques;Dry needling    PT Next Visit Plan  Trial thoracic book openers next session. focus on movements/muscles for ROT. continue with manual .  thoraicic mobility (ROT, flexion/extension)    PT Home Exercise Plan  supines cervical and scapular retraction; 5/3 CCK scapular protraction, towel roll down back and pec stretch at doorway;5/5:  cervical isometrics. cervical/thoracic excursions, sitting cervical and scapular retraction 04/21/20 SNAG R rotation    Consulted and Agree with Plan of Care  Patient       Patient will benefit from skilled therapeutic intervention in order to improve the following deficits and impairments:  Decreased range of motion, Increased muscle spasms, Increased fascial restricitons, Pain, Decreased strength, Postural dysfunction  Visit Diagnosis: Cervicalgia  Radiculopathy, cervical region     Problem List Patient Active Problem List   Diagnosis Date Noted  . FH: colon polyps 07/14/2019  . Hyperlipidemia LDL goal < 100 07/09/2013  . History of CVA (cerebrovascular accident) without residual deficits 07/09/2013  . PUD (peptic ulcer disease) 06/03/2012    Class: History of  . FH: colon cancer 06/03/2012  . GERD (gastroesophageal reflux disease) 06/03/2012  . Patent foramen ovale 05/21/2012  . MELENA 08/31/2010  . EPIGASTRIC PAIN 08/31/2010  . HYPERTENSION, BENIGN 07/04/2010  . CAD, NATIVE VESSEL 07/04/2010  . KNEE, ARTHRITIS, DEGEN./OSTEO 12/13/2009  . DERANGEMENT MENISCUS 12/13/2009  . SHOULDER PAIN 12/30/2008  . IMPINGEMENT SYNDROME 12/30/2008   Clarene Critchley PT, DPT 8:58 AM, 05/04/20 [facility] [address] [place], [place], [address] Phone: [phone]   Fax:  [phone]  Name: [name] [name] MRN: [id number] Date of Birth: June 04, 1946

## 2020-05-05 ENCOUNTER — Other Ambulatory Visit (HOSPITAL_COMMUNITY): Payer: Self-pay | Admitting: Neurosurgery

## 2020-05-05 ENCOUNTER — Other Ambulatory Visit: Payer: Self-pay | Admitting: Neurosurgery

## 2020-05-05 ENCOUNTER — Telehealth (HOSPITAL_COMMUNITY): Payer: Self-pay | Admitting: Physical Therapy

## 2020-05-05 ENCOUNTER — Ambulatory Visit (HOSPITAL_COMMUNITY)
Admission: RE | Admit: 2020-05-05 | Discharge: 2020-05-05 | Disposition: A | Discharge status: Home / Self Care | Payer: PPO | Source: Ambulatory Visit | Attending: Neurosurgery | Admitting: Neurosurgery

## 2020-05-05 DIAGNOSIS — G5600 Carpal tunnel syndrome, unspecified upper limb: Secondary | ICD-10-CM | POA: Diagnosis not present

## 2020-05-05 DIAGNOSIS — M542 Cervicalgia: Secondary | ICD-10-CM | POA: Insufficient documentation

## 2020-05-05 NOTE — Telephone Encounter (Signed)
Patient called to cx all future apptments until after his MRI - patient request to be on place on hold request that we call him back to check on him in 2 weeks

## 2020-05-06 ENCOUNTER — Ambulatory Visit (HOSPITAL_COMMUNITY): Payer: PPO | Admitting: Physical Therapy

## 2020-05-09 ENCOUNTER — Ambulatory Visit (HOSPITAL_COMMUNITY): Payer: PPO | Admitting: Physical Therapy

## 2020-05-11 ENCOUNTER — Encounter (HOSPITAL_COMMUNITY): Payer: PPO | Admitting: Physical Therapy

## 2020-05-13 ENCOUNTER — Encounter (HOSPITAL_COMMUNITY): Payer: PPO | Admitting: Physical Therapy

## 2020-05-27 DIAGNOSIS — G5603 Carpal tunnel syndrome, bilateral upper limbs: Secondary | ICD-10-CM | POA: Diagnosis not present

## 2020-06-03 DIAGNOSIS — R42 Dizziness and giddiness: Secondary | ICD-10-CM | POA: Diagnosis not present

## 2020-06-03 DIAGNOSIS — R944 Abnormal results of kidney function studies: Secondary | ICD-10-CM | POA: Diagnosis not present

## 2020-06-03 DIAGNOSIS — G459 Transient cerebral ischemic attack, unspecified: Secondary | ICD-10-CM | POA: Diagnosis not present

## 2020-06-03 DIAGNOSIS — K219 Gastro-esophageal reflux disease without esophagitis: Secondary | ICD-10-CM | POA: Diagnosis not present

## 2020-06-03 DIAGNOSIS — G4489 Other headache syndrome: Secondary | ICD-10-CM | POA: Diagnosis not present

## 2020-06-03 DIAGNOSIS — I2510 Atherosclerotic heart disease of native coronary artery without angina pectoris: Secondary | ICD-10-CM | POA: Diagnosis not present

## 2020-06-03 DIAGNOSIS — B308 Other viral conjunctivitis: Secondary | ICD-10-CM | POA: Diagnosis not present

## 2020-06-03 DIAGNOSIS — R5383 Other fatigue: Secondary | ICD-10-CM | POA: Diagnosis not present

## 2020-06-03 DIAGNOSIS — Z0000 Encounter for general adult medical examination without abnormal findings: Secondary | ICD-10-CM | POA: Diagnosis not present

## 2020-06-03 DIAGNOSIS — E782 Mixed hyperlipidemia: Secondary | ICD-10-CM | POA: Diagnosis not present

## 2020-06-03 DIAGNOSIS — N401 Enlarged prostate with lower urinary tract symptoms: Secondary | ICD-10-CM | POA: Diagnosis not present

## 2020-06-03 DIAGNOSIS — R7301 Impaired fasting glucose: Secondary | ICD-10-CM | POA: Diagnosis not present

## 2020-06-03 DIAGNOSIS — I10 Essential (primary) hypertension: Secondary | ICD-10-CM | POA: Diagnosis not present

## 2020-06-03 DIAGNOSIS — M542 Cervicalgia: Secondary | ICD-10-CM | POA: Diagnosis not present

## 2020-06-28 DIAGNOSIS — G5600 Carpal tunnel syndrome, unspecified upper limb: Secondary | ICD-10-CM | POA: Diagnosis not present

## 2020-06-28 DIAGNOSIS — M542 Cervicalgia: Secondary | ICD-10-CM | POA: Diagnosis not present

## 2020-07-13 DIAGNOSIS — M542 Cervicalgia: Secondary | ICD-10-CM | POA: Diagnosis not present

## 2020-07-13 DIAGNOSIS — R42 Dizziness and giddiness: Secondary | ICD-10-CM | POA: Diagnosis not present

## 2020-07-18 ENCOUNTER — Other Ambulatory Visit (HOSPITAL_COMMUNITY): Payer: Self-pay | Admitting: Internal Medicine

## 2020-07-18 ENCOUNTER — Ambulatory Visit (HOSPITAL_COMMUNITY)
Admission: RE | Admit: 2020-07-18 | Discharge: 2020-07-18 | Disposition: A | Discharge status: Home / Self Care | Payer: PPO | Source: Ambulatory Visit | Attending: Internal Medicine | Admitting: Internal Medicine

## 2020-07-18 ENCOUNTER — Other Ambulatory Visit: Payer: Self-pay

## 2020-07-18 DIAGNOSIS — J9811 Atelectasis: Secondary | ICD-10-CM | POA: Diagnosis not present

## 2020-07-18 DIAGNOSIS — J90 Pleural effusion, not elsewhere classified: Secondary | ICD-10-CM | POA: Diagnosis not present

## 2020-07-18 DIAGNOSIS — R079 Chest pain, unspecified: Secondary | ICD-10-CM | POA: Insufficient documentation

## 2020-07-18 DIAGNOSIS — R0789 Other chest pain: Secondary | ICD-10-CM

## 2020-07-18 DIAGNOSIS — W1830XA Fall on same level, unspecified, initial encounter: Secondary | ICD-10-CM | POA: Diagnosis not present

## 2020-07-21 ENCOUNTER — Telehealth: Payer: Self-pay | Admitting: Student

## 2020-07-21 NOTE — Telephone Encounter (Signed)
Wife reports spouse has been experiencing abnormal fatigue lately. She says if husband works for an hour, he has to then rest for an hour. He fell recently and struck chest. CXR showed atelectasis. He was a former patient of Dr.Koneswaran and was due for September follow up. She agrees to appointment in the [place] office with Dr.Hochrein on 08/12/20 at 10 am. I encouraged her to call back should symptoms change.

## 2020-07-21 NOTE — Telephone Encounter (Signed)
Pt's Wife called yesterday. Has questions for Nurse   Please call [phone]   Thanks renee

## 2020-07-27 ENCOUNTER — Other Ambulatory Visit: Payer: Self-pay | Admitting: Internal Medicine

## 2020-07-27 ENCOUNTER — Other Ambulatory Visit (HOSPITAL_COMMUNITY): Payer: Self-pay | Admitting: Internal Medicine

## 2020-07-27 DIAGNOSIS — R42 Dizziness and giddiness: Secondary | ICD-10-CM | POA: Diagnosis not present

## 2020-07-27 DIAGNOSIS — B308 Other viral conjunctivitis: Secondary | ICD-10-CM | POA: Diagnosis not present

## 2020-07-27 DIAGNOSIS — I2510 Atherosclerotic heart disease of native coronary artery without angina pectoris: Secondary | ICD-10-CM | POA: Diagnosis not present

## 2020-07-27 DIAGNOSIS — R7301 Impaired fasting glucose: Secondary | ICD-10-CM | POA: Diagnosis not present

## 2020-07-27 DIAGNOSIS — R944 Abnormal results of kidney function studies: Secondary | ICD-10-CM | POA: Diagnosis not present

## 2020-07-27 DIAGNOSIS — E782 Mixed hyperlipidemia: Secondary | ICD-10-CM | POA: Diagnosis not present

## 2020-07-27 DIAGNOSIS — Z0000 Encounter for general adult medical examination without abnormal findings: Secondary | ICD-10-CM | POA: Diagnosis not present

## 2020-07-27 DIAGNOSIS — G4489 Other headache syndrome: Secondary | ICD-10-CM | POA: Diagnosis not present

## 2020-07-27 DIAGNOSIS — M542 Cervicalgia: Secondary | ICD-10-CM | POA: Diagnosis not present

## 2020-07-27 DIAGNOSIS — I10 Essential (primary) hypertension: Secondary | ICD-10-CM | POA: Diagnosis not present

## 2020-07-27 DIAGNOSIS — N401 Enlarged prostate with lower urinary tract symptoms: Secondary | ICD-10-CM | POA: Diagnosis not present

## 2020-07-27 DIAGNOSIS — R5383 Other fatigue: Secondary | ICD-10-CM | POA: Diagnosis not present

## 2020-07-27 DIAGNOSIS — Z0001 Encounter for general adult medical examination with abnormal findings: Secondary | ICD-10-CM | POA: Diagnosis not present

## 2020-07-28 ENCOUNTER — Other Ambulatory Visit (HOSPITAL_COMMUNITY): Payer: Self-pay | Admitting: Internal Medicine

## 2020-07-28 DIAGNOSIS — R42 Dizziness and giddiness: Secondary | ICD-10-CM

## 2020-08-10 DIAGNOSIS — G459 Transient cerebral ischemic attack, unspecified: Secondary | ICD-10-CM | POA: Diagnosis not present

## 2020-08-10 DIAGNOSIS — N401 Enlarged prostate with lower urinary tract symptoms: Secondary | ICD-10-CM | POA: Diagnosis not present

## 2020-08-10 DIAGNOSIS — E782 Mixed hyperlipidemia: Secondary | ICD-10-CM | POA: Diagnosis not present

## 2020-08-10 DIAGNOSIS — R944 Abnormal results of kidney function studies: Secondary | ICD-10-CM | POA: Diagnosis not present

## 2020-08-10 DIAGNOSIS — Z6826 Body mass index (BMI) 26.0-26.9, adult: Secondary | ICD-10-CM | POA: Diagnosis not present

## 2020-08-10 DIAGNOSIS — R7301 Impaired fasting glucose: Secondary | ICD-10-CM | POA: Diagnosis not present

## 2020-08-10 DIAGNOSIS — K219 Gastro-esophageal reflux disease without esophagitis: Secondary | ICD-10-CM | POA: Diagnosis not present

## 2020-08-10 DIAGNOSIS — I10 Essential (primary) hypertension: Secondary | ICD-10-CM | POA: Diagnosis not present

## 2020-08-10 DIAGNOSIS — I2510 Atherosclerotic heart disease of native coronary artery without angina pectoris: Secondary | ICD-10-CM | POA: Diagnosis not present

## 2020-08-10 DIAGNOSIS — Z6828 Body mass index (BMI) 28.0-28.9, adult: Secondary | ICD-10-CM | POA: Diagnosis not present

## 2020-08-11 DIAGNOSIS — Z7189 Other specified counseling: Secondary | ICD-10-CM | POA: Insufficient documentation

## 2020-08-11 NOTE — Progress Notes (Signed)
Cardiology Office Note   Date:  08/12/2020   ID:  [name] [name], DOB 10-05-1946, MRN [id number]  PCP:  Celene Squibb, MD  Cardiologist:   Minus Breeding, MD   Chief Complaint  Patient presents with  . Fatigue      History of Present Illness: [name] [name] is a 74 y.o. male who presents for evaluation of increasing fatigue.  His wife called to report this observation.  He has a history of non obstructive CAD.  Coronary angiography on 08/03/2004 demonstrated a 50-60% stenosis in the proximal left circumflex coronary artery. A nuclear stress test on 03/19/2018 demonstrated a large prior inferior/inferoseptal myocardial infarction with no evidence of ischemia. Echocardiogram on 03/19/18 showed normal left ventricular systolic function and regional wall motion, LVEF 55%, mild LVH, grade 1 diastolic dysfunction without a wall motion abnormality.   He had evidence of a PFO or secundum ASD. Pulmonary pressures were normal.  Event monitoring in April 2019 showed no significant arrhythmias with PVCs.   He has had an embolic CVA.    He sees me today because he has been having increasing fatigue.  He try to minimize his visits he has daughter reports that he is always been very active gentleman and now he is having to come in and rest multiple times a day.  This is been for about 5 months.  He does say he is sleeping okay although he does get up to go to the bathroom maybe several times a night.  He is not describing any new chest pressure, neck or arm discomfort.  Is not having any new palpitations, presyncope or syncope.  He has what he thinks is vertigo with dizziness and is having this worked up.  Is been treated with meclizine which might help a little bit.  He has a CT scheduled.   Past Medical History:  Diagnosis Date  . BPH (benign prostatic hyperplasia)   . CAD (coronary artery disease)    nonobstructive  . Cholesterol depletion   . CVA (cerebral vascular accident) ([place]) 2007  . HTN  (hypertension)   . Patent foramen ovale    on plavix, followed by Dr. Jenell Milliner    Past Surgical History:  Procedure Laterality Date  . COLONOSCOPY  2003   hemorrhoids,otherwise normal.   . COLONOSCOPY  09/2010   internal hemorrhoids, next TCS in 09/2015  . COLONOSCOPY N/A 07/21/2019   Procedure: COLONOSCOPY;  Surgeon: Daneil Dolin, MD;  [facility];  Service: Endoscopy;  Laterality: N/A;  1:15pm-moved up to 12:00 per Mindy  . ESOPHAGOGASTRODUODENOSCOPY  02/26/11   esophageal erosions consistent with mild relfux/small HH, antral erosions bx mild chronic gastritis and NO h.pylori  . ESOPHAGOGASTRODUODENOSCOPY  09/2010   schatzki ring not manipulated. gastric ulcer with satellite erosions  . GALLBLADDER SURGERY    . INGUINAL HERNIA REPAIR  2006   left     Current Outpatient Medications  Medication Sig Dispense Refill  . atorvastatin (LIPITOR) 20 MG tablet Take 20 mg by mouth daily.    . clopidogrel (PLAVIX) 75 MG tablet Take 1 tablet (75 mg total) by mouth daily. 90 tablet 3  . doxazosin (CARDURA) 8 MG tablet Take 1 tablet (8 mg total) by mouth at bedtime. 90 tablet 3  . finasteride (PROSCAR) 5 MG tablet Take 5 mg by mouth daily.      . fish oil-omega-3 fatty acids 1000 MG capsule Take 1 g by mouth daily.     . Flaxseed, Linseed, (  FLAX SEED OIL) 1000 MG CAPS Take 1 capsule by mouth daily.    . Melatonin 10 MG TABS Take 1 tablet by mouth at bedtime.    . nitroGLYCERIN (NITROSTAT) 0.4 MG SL tablet Place 1 tablet (0.4 mg total) under the tongue every 5 (five) minutes as needed. 25 tablet 3   No current facility-administered medications for this visit.    Allergies:   Asa [aspirin]    ROS:  Please see the history of present illness.   Otherwise, review of systems are positive for none.   All other systems are reviewed and negative.    PHYSICAL EXAM: VS:  BP 124/60   Pulse (!) 56   Ht 5\' 10"  (1.778 m)   Wt 188 lb 9.6 oz (85.5 kg)   SpO2 97%   BMI 27.06 kg/m  ,  BMI Body mass index is 27.06 kg/m. GENERAL:  Well appearing NECK:  No jugular venous distention, waveform within normal limits, carotid upstroke brisk and symmetric, no bruits, no thyromegaly LUNGS:  Clear to auscultation bilaterally CHEST:  Unremarkable HEART:  PMI not displaced or sustained,S1 and S2 within normal limits, no S3, no S4, no clicks, no rubs, no murmurs ABD:  Flat, positive bowel sounds normal in frequency in pitch, no bruits, no rebound, no guarding, no midline pulsatile mass, no hepatomegaly, no splenomegaly EXT:  2 plus pulses throughout, no edema, no cyanosis no clubbing  EKG:  EKG is not ordered today. The ekg ordered 08/12/2020 demonstrates sinus bradycardia, rate 59, axis within normal limits, intervals within normal limits, no acute ST-T wave changes.   Recent Labs: No results found for requested labs within last 8760 hours.    Lipid Panel    Component Value Date/Time   CHOL 162 06/23/2009 2331   TRIG 113 06/23/2009 2331   HDL 47 06/23/2009 2331   CHOLHDL 3.4 Ratio 06/23/2009 2331   VLDL 23 06/23/2009 2331   LDLCALC 92 06/23/2009 2331      Wt Readings from Last 3 Encounters:  08/12/20 188 lb 9.6 oz (85.5 kg)  03/22/20 190 lb (86.2 kg)  08/20/19 187 lb (84.8 kg)      Other studies Reviewed: Additional studies/ records that were reviewed today include: Labs. Review of the above records demonstrates:  Please see elsewhere in the note.     ASSESSMENT AND PLAN:  Fatigue: The patient is not anemic.  I have asked him to get a TSH ordered by Dr. [name].  I will be investigating whether he has any significant bradycardia arrhythmias and will put him on a 3-day Zio patch.  If this is unrevealing as well as the work-up below I might consider a sleep study as he does seem to have interrupted sleeping is reported by his family.  Coronary artery disease:   I do not strongly suspect obstructive coronary disease but he did have moderate disease in the distant past.   I will order a stress test and I do not think he be able to walk on a treadmill adequately so that he will have a Lexiscan Myoview.  Palpitations:   This will be monitored as above  Hyperlipidemia:     LDL 75 recently with an HDL of 37.  No change in therapy.  History of PFO and CVA:   This was an incidental finding and I do not think contributing.  No change in therapy.  He is on Plavix.  Hypertension:   His blood pressure is controlled.  No change in therapy.  Covid education: He has been vaccinated.  Current medicines are reviewed at length with the patient today.  The patient does not have concerns regarding medicines.  The following changes have been made:  no change  Labs/ tests ordered today include:   Orders Placed This Encounter  Procedures  . NM Myocar Multi W/Spect W/Wall Motion / EF  . LONG TERM MONITOR-LIVE TELEMETRY (3-14 DAYS)  . EKG 12-Lead     Disposition:   FU with me in one year.      Signed, Minus Breeding, MD  08/12/2020 10:41 AM    [facility]

## 2020-08-12 ENCOUNTER — Other Ambulatory Visit: Payer: Self-pay

## 2020-08-12 ENCOUNTER — Ambulatory Visit: Payer: PPO | Admitting: Cardiology

## 2020-08-12 ENCOUNTER — Encounter: Payer: Self-pay | Admitting: Cardiology

## 2020-08-12 ENCOUNTER — Encounter: Payer: Self-pay | Admitting: *Deleted

## 2020-08-12 VITALS — BP 124/60 | HR 56 | Ht 70.0 in | Wt 188.6 lb

## 2020-08-12 DIAGNOSIS — Z7189 Other specified counseling: Secondary | ICD-10-CM | POA: Diagnosis not present

## 2020-08-12 DIAGNOSIS — I2510 Atherosclerotic heart disease of native coronary artery without angina pectoris: Secondary | ICD-10-CM | POA: Diagnosis not present

## 2020-08-12 DIAGNOSIS — I10 Essential (primary) hypertension: Secondary | ICD-10-CM | POA: Diagnosis not present

## 2020-08-12 DIAGNOSIS — Q2112 Patent foramen ovale: Secondary | ICD-10-CM

## 2020-08-12 DIAGNOSIS — Q211 Atrial septal defect: Secondary | ICD-10-CM | POA: Diagnosis not present

## 2020-08-12 DIAGNOSIS — R002 Palpitations: Secondary | ICD-10-CM

## 2020-08-12 DIAGNOSIS — E785 Hyperlipidemia, unspecified: Secondary | ICD-10-CM | POA: Diagnosis not present

## 2020-08-12 NOTE — Patient Instructions (Signed)
Your physician wants you to follow-up in: [place] will receive a reminder letter in the mail two months in advance. If you don't receive a letter, please call our office to schedule the follow-up appointment.  Your physician recommends that you continue on your current medications as directed. Please refer to the Current Medication list given to you today.  3 DAY ZIO MONITOR  Your physician has requested that you have a lexiscan myoview. For further information please visit [email or web address]. Please follow instruction sheet, as given.  Thank you for choosing [facility]!!

## 2020-08-15 ENCOUNTER — Telehealth: Payer: Self-pay | Admitting: Cardiology

## 2020-08-15 DIAGNOSIS — R7301 Impaired fasting glucose: Secondary | ICD-10-CM | POA: Diagnosis not present

## 2020-08-15 DIAGNOSIS — E782 Mixed hyperlipidemia: Secondary | ICD-10-CM | POA: Diagnosis not present

## 2020-08-15 DIAGNOSIS — Z6827 Body mass index (BMI) 27.0-27.9, adult: Secondary | ICD-10-CM | POA: Diagnosis not present

## 2020-08-15 DIAGNOSIS — G459 Transient cerebral ischemic attack, unspecified: Secondary | ICD-10-CM | POA: Diagnosis not present

## 2020-08-15 DIAGNOSIS — I10 Essential (primary) hypertension: Secondary | ICD-10-CM | POA: Diagnosis not present

## 2020-08-15 DIAGNOSIS — K219 Gastro-esophageal reflux disease without esophagitis: Secondary | ICD-10-CM | POA: Diagnosis not present

## 2020-08-15 DIAGNOSIS — I2510 Atherosclerotic heart disease of native coronary artery without angina pectoris: Secondary | ICD-10-CM | POA: Diagnosis not present

## 2020-08-15 DIAGNOSIS — Z0001 Encounter for general adult medical examination with abnormal findings: Secondary | ICD-10-CM | POA: Diagnosis not present

## 2020-08-15 DIAGNOSIS — N401 Enlarged prostate with lower urinary tract symptoms: Secondary | ICD-10-CM | POA: Diagnosis not present

## 2020-08-15 NOTE — Telephone Encounter (Signed)
error 

## 2020-08-24 ENCOUNTER — Ambulatory Visit (HOSPITAL_COMMUNITY)
Admission: RE | Admit: 2020-08-24 | Discharge: 2020-08-24 | Disposition: A | Discharge status: Home / Self Care | Payer: PPO | Source: Ambulatory Visit | Attending: Internal Medicine | Admitting: Internal Medicine

## 2020-08-24 ENCOUNTER — Encounter (HOSPITAL_COMMUNITY)
Admission: RE | Admit: 2020-08-24 | Discharge: 2020-08-24 | Disposition: A | Discharge status: Home / Self Care | Payer: PPO | Source: Ambulatory Visit | Attending: Internal Medicine | Admitting: Internal Medicine

## 2020-08-24 ENCOUNTER — Ambulatory Visit (HOSPITAL_BASED_OUTPATIENT_CLINIC_OR_DEPARTMENT_OTHER)
Admission: RE | Admit: 2020-08-24 | Discharge: 2020-08-24 | Disposition: A | Discharge status: Home / Self Care | Payer: PPO | Source: Ambulatory Visit | Attending: Cardiology | Admitting: Cardiology

## 2020-08-24 ENCOUNTER — Other Ambulatory Visit: Payer: Self-pay

## 2020-08-24 DIAGNOSIS — I2510 Atherosclerotic heart disease of native coronary artery without angina pectoris: Secondary | ICD-10-CM

## 2020-08-24 DIAGNOSIS — R5383 Other fatigue: Secondary | ICD-10-CM | POA: Diagnosis not present

## 2020-08-24 DIAGNOSIS — R42 Dizziness and giddiness: Secondary | ICD-10-CM | POA: Insufficient documentation

## 2020-08-24 DIAGNOSIS — R55 Syncope and collapse: Secondary | ICD-10-CM | POA: Diagnosis not present

## 2020-08-24 DIAGNOSIS — I6523 Occlusion and stenosis of bilateral carotid arteries: Secondary | ICD-10-CM | POA: Diagnosis not present

## 2020-08-24 DIAGNOSIS — R9082 White matter disease, unspecified: Secondary | ICD-10-CM | POA: Diagnosis not present

## 2020-08-24 DIAGNOSIS — Z8673 Personal history of transient ischemic attack (TIA), and cerebral infarction without residual deficits: Secondary | ICD-10-CM | POA: Diagnosis not present

## 2020-08-24 DIAGNOSIS — I6381 Other cerebral infarction due to occlusion or stenosis of small artery: Secondary | ICD-10-CM | POA: Diagnosis not present

## 2020-08-24 LAB — NM MYOCAR MULTI W/SPECT W/WALL MOTION / EF
LV dias vol: 112 mL (ref 62–150)
LV sys vol: 53 mL
Peak HR: 83 {beats}/min
RATE: 0.5
Rest HR: 53 {beats}/min
SDS: 2
SRS: 0
SSS: 2
TID: 1.11

## 2020-08-24 MED ORDER — SODIUM CHLORIDE FLUSH 0.9 % IV SOLN
INTRAVENOUS | Status: AC
  Administered 2020-08-24: 10 mL via INTRAVENOUS
  Filled 2020-08-24: qty 10

## 2020-08-24 MED ORDER — REGADENOSON 0.4 MG/5ML IV SOLN
INTRAVENOUS | Status: AC
  Administered 2020-08-24: 0.4 mg via INTRAVENOUS
  Filled 2020-08-24: qty 5

## 2020-08-24 MED ORDER — TECHNETIUM TC 99M TETROFOSMIN IV KIT
30.0000 | PACK | Freq: Once | INTRAVENOUS | Status: AC | PRN
  Administered 2020-08-24: 32.3 via INTRAVENOUS

## 2020-08-24 MED ORDER — TECHNETIUM TC 99M TETROFOSMIN IV KIT
10.0000 | PACK | Freq: Once | INTRAVENOUS | Status: AC | PRN
  Administered 2020-08-24: 11 via INTRAVENOUS

## 2020-08-25 DIAGNOSIS — K219 Gastro-esophageal reflux disease without esophagitis: Secondary | ICD-10-CM | POA: Diagnosis not present

## 2020-08-25 DIAGNOSIS — I10 Essential (primary) hypertension: Secondary | ICD-10-CM | POA: Diagnosis not present

## 2020-08-25 DIAGNOSIS — Z0001 Encounter for general adult medical examination with abnormal findings: Secondary | ICD-10-CM | POA: Diagnosis not present

## 2020-08-25 DIAGNOSIS — I2510 Atherosclerotic heart disease of native coronary artery without angina pectoris: Secondary | ICD-10-CM | POA: Diagnosis not present

## 2020-08-25 DIAGNOSIS — E782 Mixed hyperlipidemia: Secondary | ICD-10-CM | POA: Diagnosis not present

## 2020-08-25 DIAGNOSIS — N401 Enlarged prostate with lower urinary tract symptoms: Secondary | ICD-10-CM | POA: Diagnosis not present

## 2020-08-25 DIAGNOSIS — G459 Transient cerebral ischemic attack, unspecified: Secondary | ICD-10-CM | POA: Diagnosis not present

## 2020-08-25 DIAGNOSIS — R7301 Impaired fasting glucose: Secondary | ICD-10-CM | POA: Diagnosis not present

## 2020-08-25 DIAGNOSIS — Z6827 Body mass index (BMI) 27.0-27.9, adult: Secondary | ICD-10-CM | POA: Diagnosis not present

## 2020-08-31 ENCOUNTER — Ambulatory Visit: Payer: PPO | Admitting: Student

## 2020-09-05 DIAGNOSIS — R5383 Other fatigue: Secondary | ICD-10-CM | POA: Diagnosis not present

## 2020-09-06 ENCOUNTER — Ambulatory Visit (INDEPENDENT_AMBULATORY_CARE_PROVIDER_SITE_OTHER): Payer: PPO

## 2020-09-06 ENCOUNTER — Other Ambulatory Visit: Payer: Self-pay | Admitting: *Deleted

## 2020-09-06 DIAGNOSIS — R002 Palpitations: Secondary | ICD-10-CM

## 2020-09-13 ENCOUNTER — Telehealth: Payer: Self-pay | Admitting: *Deleted

## 2020-09-13 DIAGNOSIS — R400 Somnolence: Secondary | ICD-10-CM

## 2020-09-13 DIAGNOSIS — R0681 Apnea, not elsewhere classified: Secondary | ICD-10-CM

## 2020-09-13 NOTE — Telephone Encounter (Signed)
Left message to call back  Order placed for sleep study and message to billing

## 2020-09-13 NOTE — Telephone Encounter (Signed)
-----   Message from Minus Breeding, MD sent at 09/06/2020  8:26 PM EDT ----- No significant arrhythmias.  He also had no ischemia on perfusion study.  Please order a sleep apnea study for evaluation of daytime sleepiness and nighttime apnea.  Call Mr. Heckard with the results and send results to Celene Squibb, MD

## 2020-09-15 NOTE — Telephone Encounter (Signed)
Left the pt a message to call the office back. 

## 2020-09-15 NOTE — Telephone Encounter (Signed)
Maxime is returning Melinda's call.

## 2020-09-15 NOTE — Telephone Encounter (Signed)
Pt is returning call.  

## 2020-09-15 NOTE — Telephone Encounter (Signed)
Spoke with the pt and endorsed results and recommendations, per Dr. Percival Spanish.  Pt states he is interested in getting the sleep study done.  Informed him that the order for his sleep study has already been placed, and a message to our billing dept has been sent. Informed him that I will send a message to our sleep study pool and coordinator, to call him back and arrange this appointment accordingly. Pt verbalized understanding and agrees with this plan.

## 2020-09-16 ENCOUNTER — Telehealth: Payer: Self-pay | Admitting: *Deleted

## 2020-09-16 NOTE — Telephone Encounter (Signed)
Pa submitted to [facility] via web portal for in lab sleep study.

## 2020-09-20 ENCOUNTER — Telehealth: Payer: Self-pay | Admitting: *Deleted

## 2020-09-20 ENCOUNTER — Encounter (HOSPITAL_COMMUNITY): Payer: Self-pay | Admitting: Physical Therapy

## 2020-09-20 ENCOUNTER — Other Ambulatory Visit: Payer: Self-pay | Admitting: Cardiology

## 2020-09-20 DIAGNOSIS — R0681 Apnea, not elsewhere classified: Secondary | ICD-10-CM

## 2020-09-20 DIAGNOSIS — R400 Somnolence: Secondary | ICD-10-CM

## 2020-09-20 NOTE — Therapy (Signed)
[place], [place], [address] Phone: [phone]   Fax:  [phone]  Patient Details  Name: [name] [name] MRN: [id number] Date of Birth: [birth year] Referring Provider:  No ref. provider found  Encounter Date: 09/20/2020  PHYSICAL THERAPY DISCHARGE SUMMARY  Visits from Start of Care: 8    Current functional level related to goals / functional outcomes: Unknown as patient did not return to therapy and requested cancelling all remaining appointments    Remaining deficits: Unknown as patient did not return to therapy and requested cancelling all remaining appointments   Education / Equipment: HEP Plan: Patient agrees to discharge.  Patient goals were not met. Patient is being discharged due to not returning since the last visit.  ?????      Clarene Critchley PT, DPT 10:08 AM, 09/20/20 [place], [place], [address] Phone: [phone]   Fax:  [phone]

## 2020-09-20 NOTE — Telephone Encounter (Addendum)
Per Debbie @ [facility] in lab PA submitted to [facility] via web portal denied no signs or symptoms of OSA and no disease process.Marland Kitchen HST approved no PA.

## 2020-09-20 NOTE — Telephone Encounter (Signed)
Patient notified of [facility] appointment details. [facility] denied in lab study. Patient has no co morbidities. Did not meet requirements.

## 2020-09-29 ENCOUNTER — Ambulatory Visit: Payer: PPO | Attending: Internal Medicine

## 2020-09-29 DIAGNOSIS — Z23 Encounter for immunization: Secondary | ICD-10-CM

## 2020-09-29 NOTE — Progress Notes (Signed)
   Covid-19 Vaccination Clinic  Name:  Deantae Shackleton    MRN: [id number] DOB: [birth year]  09/29/2020  Mr. Dinsmore was observed post Covid-19 immunization for 15 minutes without incident. He was provided with Vaccine Information Sheet and instruction to access the V-Safe system.   Mr. Mccubbins was instructed to call 911 with any severe reactions post vaccine: Marland Kitchen Difficulty breathing  . Swelling of face and throat  . A fast heartbeat  . A bad rash all over body  . Dizziness and weakness

## 2020-09-30 ENCOUNTER — Other Ambulatory Visit: Payer: Self-pay

## 2020-09-30 ENCOUNTER — Ambulatory Visit: Payer: PPO | Attending: Cardiology | Admitting: Cardiovascular Disease

## 2020-09-30 DIAGNOSIS — N401 Enlarged prostate with lower urinary tract symptoms: Secondary | ICD-10-CM | POA: Diagnosis not present

## 2020-09-30 DIAGNOSIS — Z0001 Encounter for general adult medical examination with abnormal findings: Secondary | ICD-10-CM | POA: Diagnosis not present

## 2020-09-30 DIAGNOSIS — E782 Mixed hyperlipidemia: Secondary | ICD-10-CM | POA: Diagnosis not present

## 2020-09-30 DIAGNOSIS — R0681 Apnea, not elsewhere classified: Secondary | ICD-10-CM | POA: Diagnosis present

## 2020-09-30 DIAGNOSIS — R7301 Impaired fasting glucose: Secondary | ICD-10-CM | POA: Diagnosis not present

## 2020-09-30 DIAGNOSIS — I10 Essential (primary) hypertension: Secondary | ICD-10-CM | POA: Diagnosis not present

## 2020-09-30 DIAGNOSIS — I2510 Atherosclerotic heart disease of native coronary artery without angina pectoris: Secondary | ICD-10-CM | POA: Diagnosis not present

## 2020-09-30 DIAGNOSIS — G459 Transient cerebral ischemic attack, unspecified: Secondary | ICD-10-CM | POA: Diagnosis not present

## 2020-09-30 DIAGNOSIS — R400 Somnolence: Secondary | ICD-10-CM

## 2020-09-30 DIAGNOSIS — G4730 Sleep apnea, unspecified: Secondary | ICD-10-CM | POA: Diagnosis not present

## 2020-09-30 DIAGNOSIS — Z6827 Body mass index (BMI) 27.0-27.9, adult: Secondary | ICD-10-CM | POA: Diagnosis not present

## 2020-09-30 DIAGNOSIS — K219 Gastro-esophageal reflux disease without esophagitis: Secondary | ICD-10-CM | POA: Diagnosis not present

## 2020-10-18 ENCOUNTER — Telehealth: Payer: Self-pay

## 2020-10-18 NOTE — Procedures (Addendum)
        [facility]        Patient Name: [name] [name], [name] [name] Date: 09/30/2020 Gender: Male D.O.B: 16-Aug-1946 Age (years): 74 Referring Provider: Minus Breeding Height (inches): 20 Interpreting Physician: Shelva Majestic MD, ABSM Weight (lbs): 188 RPSGT: Rosebud Poles BMI: 27 MRN: [id number] Neck Size: <br>  CLINICAL INFORMATION Sleep Study Type: HST  Indication for sleep study: snoring, fatigue  Epworth Sleepiness Score: 9  SLEEP STUDY TECHNIQUE A multi-channel overnight portable sleep study was performed. The channels recorded were: nasal airflow, thoracic respiratory movement, and oxygen saturation with a pulse oximetry. Snoring was also monitored.  MEDICATIONS atorvastatin (LIPITOR) 20 MG tablet clopidogrel (PLAVIX) 75 MG tablet doxazosin (CARDURA) 8 MG tablet finasteride (PROSCAR) 5 MG tablet fish oil-omega-3 fatty acids 1000 MG capsule Flaxseed, Linseed, (FLAX SEED OIL) 1000 MG CAPS Melatonin 10 MG TABS nitroGLYCERIN (NITROSTAT) 0.4 MG SL tablet Patient self administered medications include: N/A.  SLEEP ARCHITECTURE Patient was studied for 515.4 minutes. The sleep efficiency was 85.9 % and the patient was supine for 4%. The arousal index was 0.0 per hour.  RESPIRATORY PARAMETERS The overall AHI was 2.9 per hour, with a central apnea index of 0.8 per hour.  The oxygen nadir was 84% during sleep.  CARDIAC DATA Mean heart rate during sleep was 52.7 bpm.  IMPRESSIONS - Increased Upper Airway Resistance (UARS) without significant obstructive sleep apnea overall (AHI 2.9/h). However, REI (respiratory events per hour) with supine sleep was mild-moderate at 14.7/h and events during REM sleep cannot be assessed on this home study. - No significant central sleep apnea occurred during this study (CAI = 0.8/h). - Moderate oxygen desaturation to a nadir of 84%. - Patient snored 43.7% during the sleep. Time spent snoring was 225.2 minutes.  DIAGNOSIS - Sleep Apnea,  unspecified G 47.30  RECOMMENDATIONS - Effort should be made to optimize nasal and oropharyngeal patency.   - With mild-moderate sleep apnea with supine sleep the patient should be counselled to avoid supine sleep; consider positional therapy to avoid supine position during sleep. - At present patient does not meet critieria for CPAP.  Consider alternatives for significant snoring.  If patient develops progressive symptoms consider future in lab evaluation. - Avoid alcohol, sedatives and other CNS depressants that may worsen sleep apnea and disrupt normal sleep architecture. - Sleep hygiene sh ould be reviewed to assess factors that may improve sleep quality. - Weight management and regular exercise should be initiated or continued.   [Electronically signed] 10/18/2020 11:06 AM  Shelva Majestic MD, [facility], ABSM Diplomate, American Board of Sleep Medicine   NPI: [id number] [place] PH: [phone]   FX: [phone] [place]

## 2020-10-18 NOTE — Telephone Encounter (Signed)
Per DPR review sleep study results and recommendations with patients wife She is aware and agreeable She states she will relay the message to him.

## 2020-10-18 NOTE — Telephone Encounter (Signed)
-----   Message from Troy Sine, MD sent at 10/18/2020 11:17 AM EDT ----- Gae Bon, please notify pt of the results of the sleep study.

## 2020-10-24 ENCOUNTER — Telehealth: Payer: Self-pay | Admitting: *Deleted

## 2020-10-24 NOTE — Telephone Encounter (Signed)
Informed patient of sleep study results and patient understanding was verbalized. Patient understands her sleep study showed   IMPRESSIONS - Increased Upper Airway Resistance (UARS) without significant obstructive sleep apnea overall (AHI 2.9/h). However, REI (respiratory events per hour) with supine sleep was mild-moderate at 14.7/h and events during REM sleep cannot be assessed on this home study. - Moderate oxygen desaturation to a nadir of 84%. - Patient snored 43.7% during the sleep. Time spent snoring was 225.2 minutes.  DIAGNOSIS - Sleep Apnea, unspecified G 47.30  RECOMMENDATIONS - Effort should be made to optimize nasal and oropharyngeal patency.   - With mild-moderate sleep apnea with supine sleep the patient should be counselled to avoid supine sleep; consider positional therapy to avoid supine position during sleep. - At present patient does not meet critieria for CPAP.  Consider alternatives for significant snoring.  If patient develops progressive symptoms consider future in lab evaluation.   PER DPR Left detailed message on voicemail and informed patient to call back with questions

## 2020-10-24 NOTE — Telephone Encounter (Signed)
-----   Message from Troy Sine, MD sent at 10/18/2020 11:17 AM EDT ----- Gae Bon, please notify pt of the results of the sleep study.

## 2020-10-28 NOTE — Telephone Encounter (Signed)
Informed patient of sleep study results and patient understanding was verbalized.  

## 2020-12-01 DIAGNOSIS — Z23 Encounter for immunization: Secondary | ICD-10-CM | POA: Diagnosis not present

## 2021-01-05 DIAGNOSIS — M542 Cervicalgia: Secondary | ICD-10-CM | POA: Diagnosis not present

## 2021-01-05 DIAGNOSIS — R030 Elevated blood-pressure reading, without diagnosis of hypertension: Secondary | ICD-10-CM | POA: Diagnosis not present

## 2021-01-05 DIAGNOSIS — G5600 Carpal tunnel syndrome, unspecified upper limb: Secondary | ICD-10-CM | POA: Diagnosis not present

## 2021-01-05 DIAGNOSIS — Z6826 Body mass index (BMI) 26.0-26.9, adult: Secondary | ICD-10-CM | POA: Diagnosis not present

## 2021-01-19 DIAGNOSIS — G5600 Carpal tunnel syndrome, unspecified upper limb: Secondary | ICD-10-CM | POA: Diagnosis not present

## 2021-02-16 DIAGNOSIS — G5600 Carpal tunnel syndrome, unspecified upper limb: Secondary | ICD-10-CM | POA: Diagnosis not present

## 2021-02-20 DIAGNOSIS — R7301 Impaired fasting glucose: Secondary | ICD-10-CM | POA: Diagnosis not present

## 2021-02-20 DIAGNOSIS — I10 Essential (primary) hypertension: Secondary | ICD-10-CM | POA: Diagnosis not present

## 2021-02-20 DIAGNOSIS — Z0000 Encounter for general adult medical examination without abnormal findings: Secondary | ICD-10-CM | POA: Diagnosis not present

## 2021-02-20 DIAGNOSIS — E782 Mixed hyperlipidemia: Secondary | ICD-10-CM | POA: Diagnosis not present

## 2021-02-23 DIAGNOSIS — I10 Essential (primary) hypertension: Secondary | ICD-10-CM | POA: Diagnosis not present

## 2021-02-23 DIAGNOSIS — G459 Transient cerebral ischemic attack, unspecified: Secondary | ICD-10-CM | POA: Diagnosis not present

## 2021-02-23 DIAGNOSIS — I2510 Atherosclerotic heart disease of native coronary artery without angina pectoris: Secondary | ICD-10-CM | POA: Diagnosis not present

## 2021-02-23 DIAGNOSIS — E782 Mixed hyperlipidemia: Secondary | ICD-10-CM | POA: Diagnosis not present

## 2021-02-23 DIAGNOSIS — N401 Enlarged prostate with lower urinary tract symptoms: Secondary | ICD-10-CM | POA: Diagnosis not present

## 2021-02-23 DIAGNOSIS — K219 Gastro-esophageal reflux disease without esophagitis: Secondary | ICD-10-CM | POA: Diagnosis not present

## 2021-02-23 DIAGNOSIS — E663 Overweight: Secondary | ICD-10-CM | POA: Diagnosis not present

## 2021-02-23 DIAGNOSIS — R7301 Impaired fasting glucose: Secondary | ICD-10-CM | POA: Diagnosis not present

## 2021-02-23 DIAGNOSIS — Z6827 Body mass index (BMI) 27.0-27.9, adult: Secondary | ICD-10-CM | POA: Diagnosis not present

## 2021-04-20 DIAGNOSIS — R42 Dizziness and giddiness: Secondary | ICD-10-CM | POA: Diagnosis not present

## 2021-04-20 DIAGNOSIS — M542 Cervicalgia: Secondary | ICD-10-CM | POA: Diagnosis not present

## 2021-04-25 ENCOUNTER — Ambulatory Visit (HOSPITAL_COMMUNITY): Payer: PPO | Attending: Internal Medicine

## 2021-05-22 ENCOUNTER — Other Ambulatory Visit: Payer: Self-pay | Admitting: Urology

## 2021-05-22 DIAGNOSIS — N401 Enlarged prostate with lower urinary tract symptoms: Secondary | ICD-10-CM

## 2021-07-30 IMAGING — CT CT HEAD W/O CM
2 series · 15 of 40 positions shown, 18 images · non-contrast
Comparison: Head CT 05/02/2016. Brain MRI 11/26/2006.

CLINICAL DATA: 74-year-old male with intermittent episodes of
vertigo for 1 year. Fatigue.

EXAM:
CT HEAD WITHOUT CONTRAST
TECHNIQUE: Contiguous axial images were obtained from the base of the skull
through the vertex without intravenous contrast.

[Series 3: head bone · axial · 0.44mm/px · z∈[-51,+91]mm · 12 of 83 slices shown, 15 images]
[im 6/83  brain]
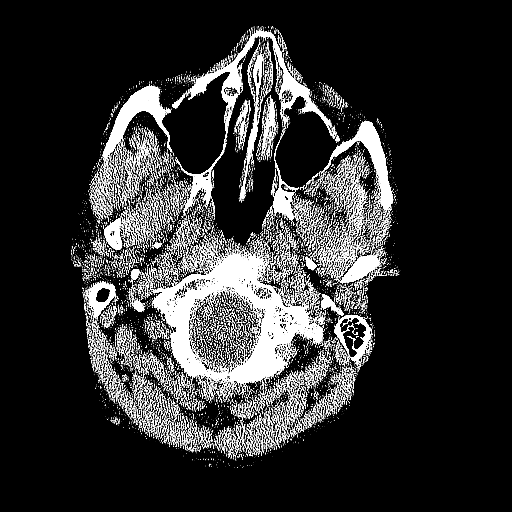
[im 6/83  bone]
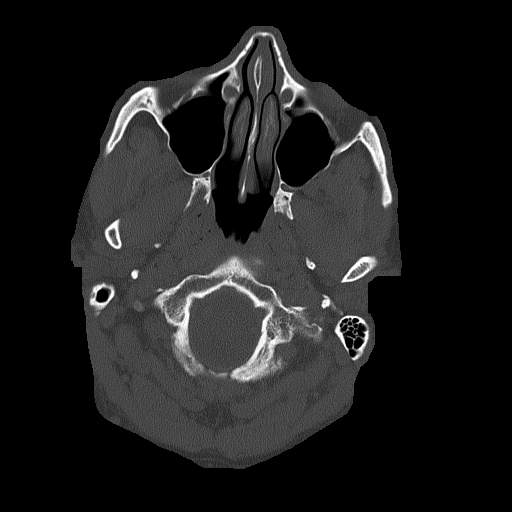
[im 12/83  brain]
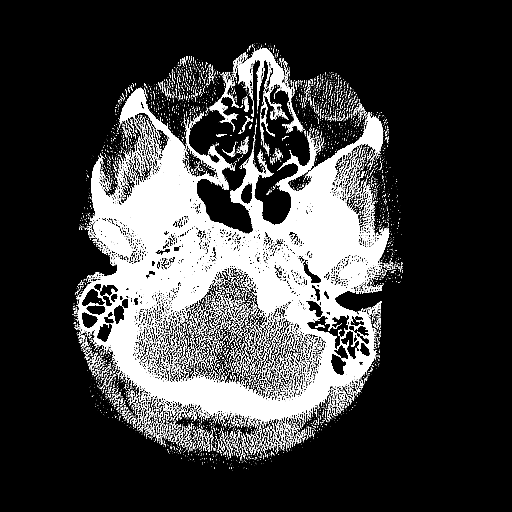
[im 17/83  brain]
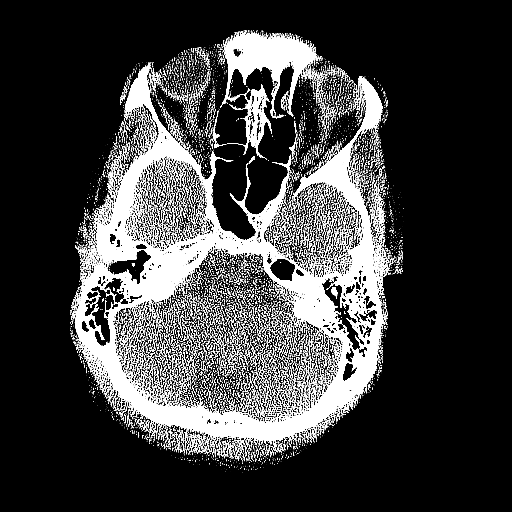
[im 26/83  brain]
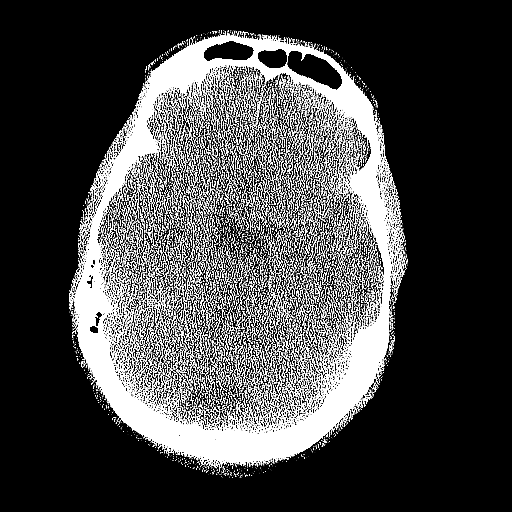
[im 32/83  brain]
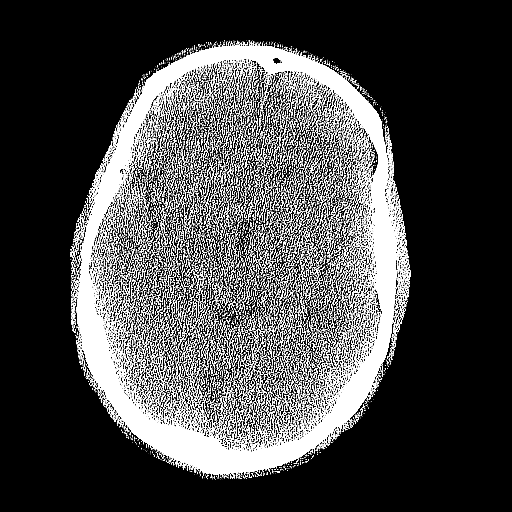
[im 32/83  bone]
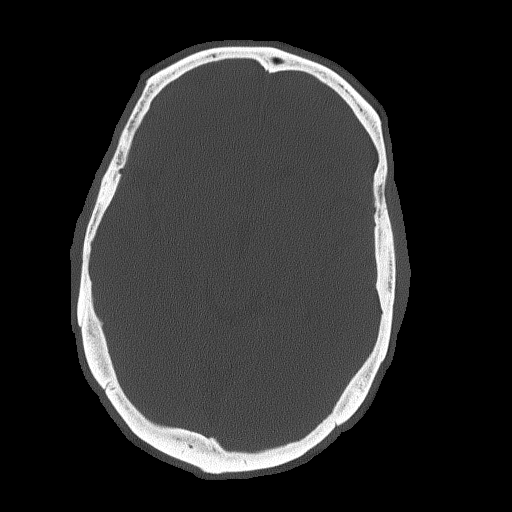
[im 37/83  brain]
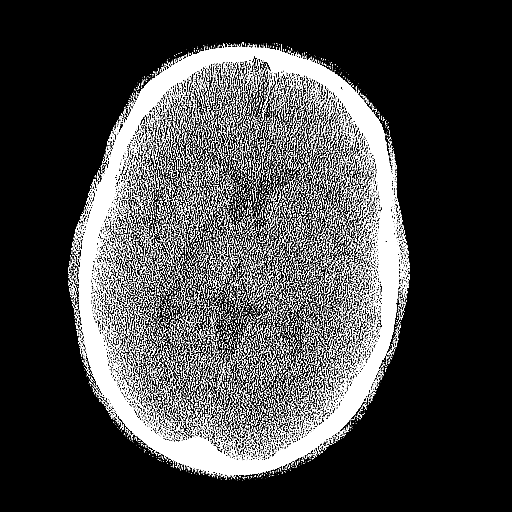
[im 46/83  brain]
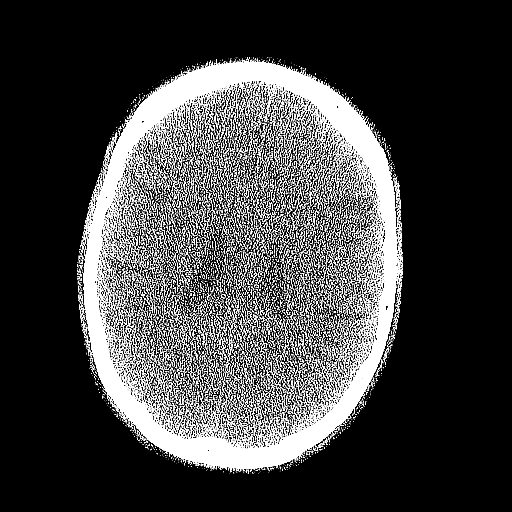
[im 51/83  brain]
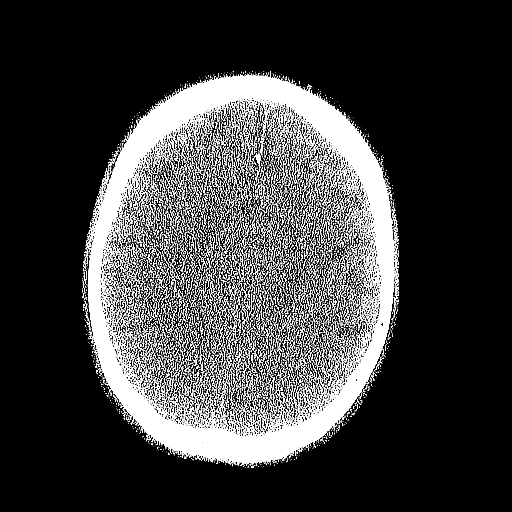
[im 57/83  brain]
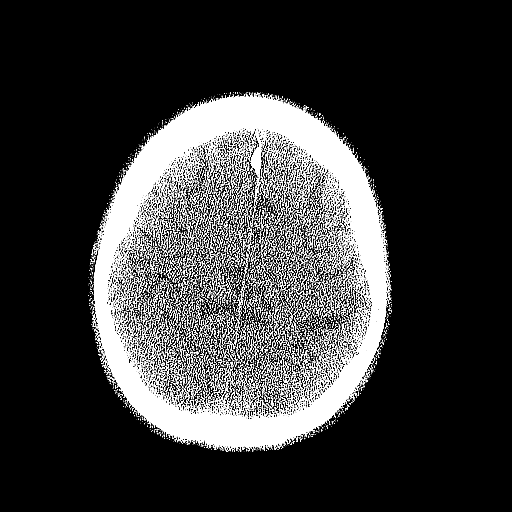
[im 57/83  bone]
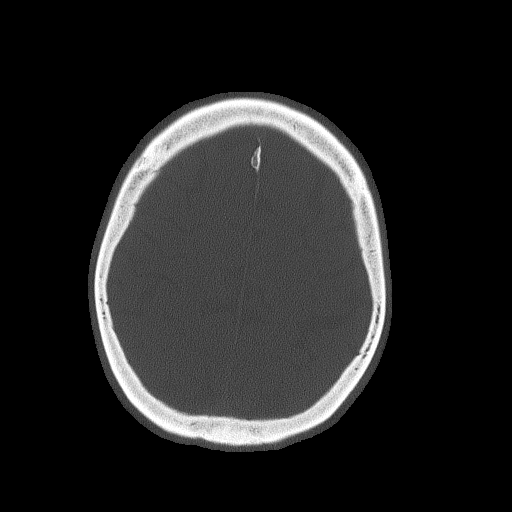
[im 66/83  brain]
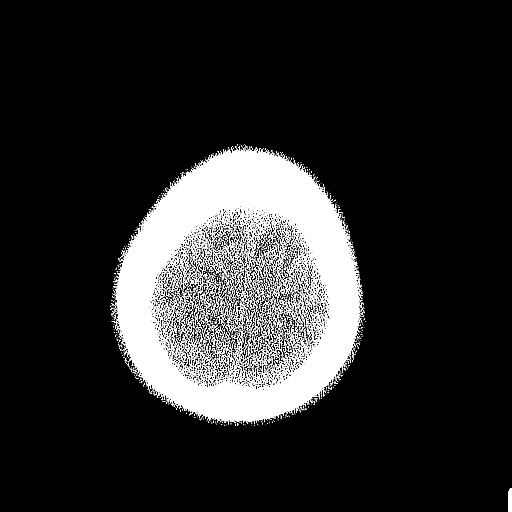
[im 71/83  brain]
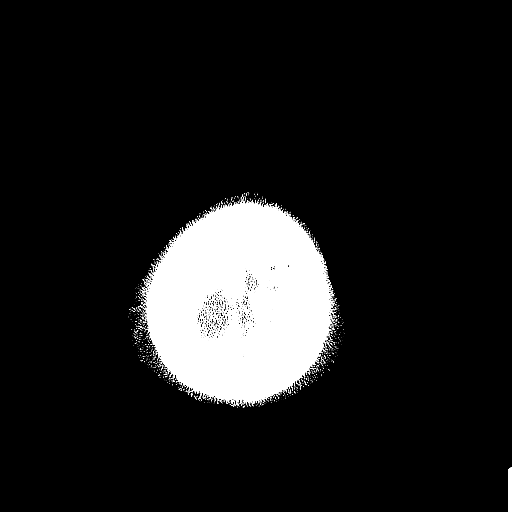
[im 77/83  brain]
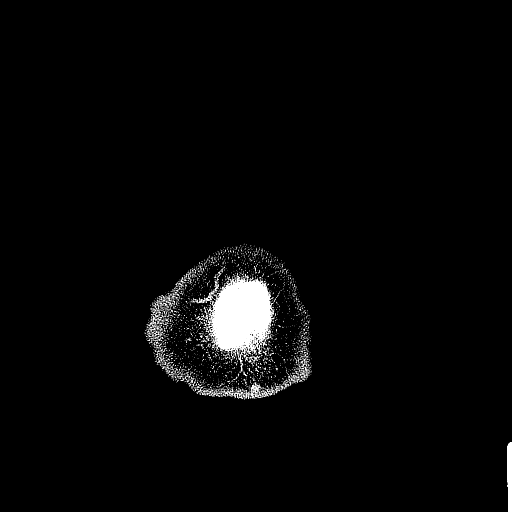

[Series 4: coronal soft · coronal · 0.35mm/px · 3 of 80 slices shown]
[im 27/80  brain]
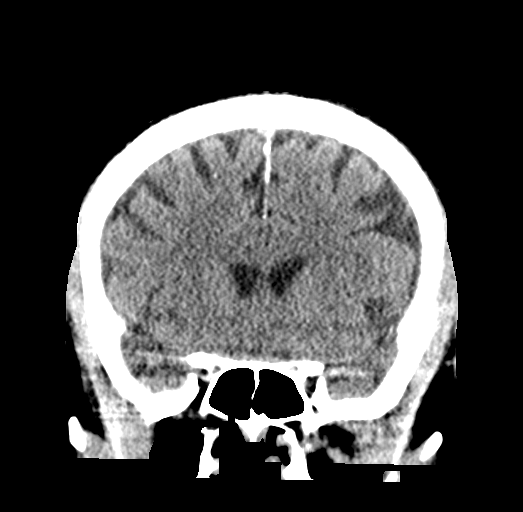
[im 36/80  brain]
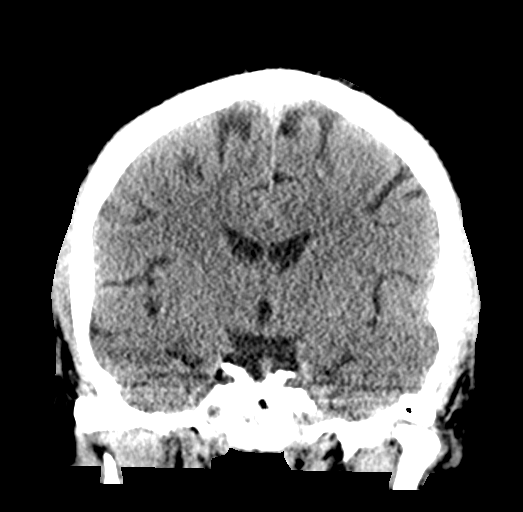
[im 44/80  brain]
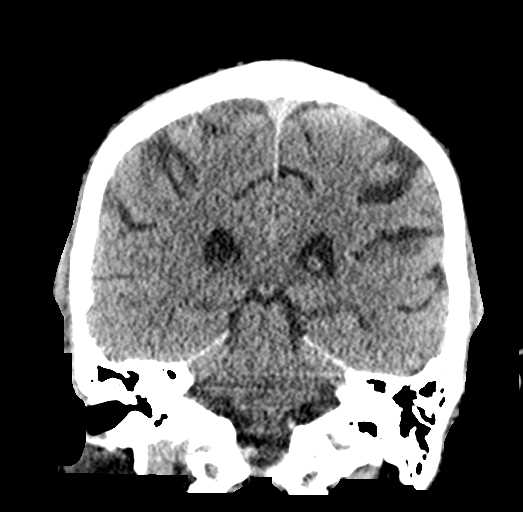

[15 of 40 positions shown; findings below may reference images not displayed]

FINDINGS: Brain: Chronic lacunar infarct of the left lentiform and corona
radiata again noted. No midline shift, ventriculomegaly, mass
effect, evidence of mass lesion, intracranial hemorrhage or evidence
of cortically based acute infarction. Stable gray-white matter
differentiation since 6025. No cortical encephalomalacia.

Vascular: No suspicious intracranial vascular hyperdensity.

Skull: Negative.  No acute osseous abnormality identified.

Sinuses/Orbits: Visualized paranasal sinuses, tympanic cavities, and
mastoids are stable and well pneumatized.

Other: Visualized orbits and scalp soft tissues are within normal
limits.
IMPRESSION: 1. No acute intracranial abnormality.
2. Stable since 6025 with a chronic lacunar infarct of the left
lentiform and corona radiata.
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
## 2021-08-14 ENCOUNTER — Other Ambulatory Visit: Payer: Self-pay

## 2021-08-14 ENCOUNTER — Ambulatory Visit: Payer: PPO | Admitting: Cardiology

## 2021-08-14 ENCOUNTER — Encounter: Payer: Self-pay | Admitting: Cardiology

## 2021-08-14 VITALS — BP 132/68 | HR 60 | Ht 70.0 in | Wt 195.0 lb

## 2021-08-14 DIAGNOSIS — E785 Hyperlipidemia, unspecified: Secondary | ICD-10-CM | POA: Diagnosis not present

## 2021-08-14 DIAGNOSIS — I2510 Atherosclerotic heart disease of native coronary artery without angina pectoris: Secondary | ICD-10-CM

## 2021-08-14 DIAGNOSIS — Q2112 Patent foramen ovale: Secondary | ICD-10-CM

## 2021-08-14 DIAGNOSIS — R002 Palpitations: Secondary | ICD-10-CM | POA: Diagnosis not present

## 2021-08-14 DIAGNOSIS — Q211 Atrial septal defect: Secondary | ICD-10-CM

## 2021-08-14 DIAGNOSIS — R5383 Other fatigue: Secondary | ICD-10-CM | POA: Diagnosis not present

## 2021-08-14 MED ORDER — ATORVASTATIN CALCIUM 20 MG PO TABS: 20.0000 mg | ORAL_TABLET | Freq: Every day | ORAL | 3 refills | Status: DC

## 2021-08-14 MED ORDER — CLOPIDOGREL BISULFATE 75 MG PO TABS: 75.0000 mg | ORAL_TABLET | Freq: Every day | ORAL | 3 refills | Status: DC

## 2021-08-14 NOTE — Patient Instructions (Signed)
Medication Instructions:  Refilled Clopidogrel  Refilled  Atorvastatin   *If you need a refill on your cardiac medications before your next appointment, please call your pharmacy*   Lab Work: Not needed   Testing/Procedures:  Not needed  Follow-Up: At [facility], you and your health needs are our priority.  As part of our continuing mission to provide you with exceptional heart care, we have created designated Provider Care Teams.  These Care Teams include your primary Cardiologist (physician) and Advanced Practice Providers (APPs -  Physician Assistants and Nurse Practitioners) who all work together to provide you with the care you need, when you need it.     Your next appointment:   12 month(s)  The format for your next appointment:   In Person  Provider:   Minus Breeding, MD

## 2021-08-28 DIAGNOSIS — Z131 Encounter for screening for diabetes mellitus: Secondary | ICD-10-CM | POA: Diagnosis not present

## 2021-08-28 DIAGNOSIS — I10 Essential (primary) hypertension: Secondary | ICD-10-CM | POA: Diagnosis not present

## 2021-08-31 DIAGNOSIS — R944 Abnormal results of kidney function studies: Secondary | ICD-10-CM | POA: Diagnosis not present

## 2021-08-31 DIAGNOSIS — R42 Dizziness and giddiness: Secondary | ICD-10-CM | POA: Diagnosis not present

## 2021-08-31 DIAGNOSIS — R7301 Impaired fasting glucose: Secondary | ICD-10-CM | POA: Diagnosis not present

## 2021-08-31 DIAGNOSIS — M542 Cervicalgia: Secondary | ICD-10-CM | POA: Diagnosis not present

## 2021-08-31 DIAGNOSIS — E782 Mixed hyperlipidemia: Secondary | ICD-10-CM | POA: Diagnosis not present

## 2021-08-31 DIAGNOSIS — I10 Essential (primary) hypertension: Secondary | ICD-10-CM | POA: Diagnosis not present

## 2021-08-31 DIAGNOSIS — Z0001 Encounter for general adult medical examination with abnormal findings: Secondary | ICD-10-CM | POA: Diagnosis not present

## 2021-08-31 DIAGNOSIS — N401 Enlarged prostate with lower urinary tract symptoms: Secondary | ICD-10-CM | POA: Diagnosis not present

## 2021-08-31 DIAGNOSIS — Z8673 Personal history of transient ischemic attack (TIA), and cerebral infarction without residual deficits: Secondary | ICD-10-CM | POA: Diagnosis not present

## 2021-09-25 DIAGNOSIS — Z23 Encounter for immunization: Secondary | ICD-10-CM | POA: Diagnosis not present

## 2021-10-03 DIAGNOSIS — Z23 Encounter for immunization: Secondary | ICD-10-CM | POA: Diagnosis not present

## 2022-02-26 DIAGNOSIS — R7301 Impaired fasting glucose: Secondary | ICD-10-CM | POA: Diagnosis not present

## 2022-02-26 DIAGNOSIS — R944 Abnormal results of kidney function studies: Secondary | ICD-10-CM | POA: Diagnosis not present

## 2022-02-26 DIAGNOSIS — E782 Mixed hyperlipidemia: Secondary | ICD-10-CM | POA: Diagnosis not present

## 2022-02-27 ENCOUNTER — Other Ambulatory Visit: Payer: Self-pay | Admitting: Urology

## 2022-02-27 DIAGNOSIS — N401 Enlarged prostate with lower urinary tract symptoms: Secondary | ICD-10-CM

## 2022-03-01 DIAGNOSIS — E782 Mixed hyperlipidemia: Secondary | ICD-10-CM | POA: Diagnosis not present

## 2022-03-01 DIAGNOSIS — M542 Cervicalgia: Secondary | ICD-10-CM | POA: Diagnosis not present

## 2022-03-01 DIAGNOSIS — Z8673 Personal history of transient ischemic attack (TIA), and cerebral infarction without residual deficits: Secondary | ICD-10-CM | POA: Diagnosis not present

## 2022-03-01 DIAGNOSIS — R7301 Impaired fasting glucose: Secondary | ICD-10-CM | POA: Diagnosis not present

## 2022-03-01 DIAGNOSIS — I10 Essential (primary) hypertension: Secondary | ICD-10-CM | POA: Diagnosis not present

## 2022-03-01 DIAGNOSIS — R42 Dizziness and giddiness: Secondary | ICD-10-CM | POA: Diagnosis not present

## 2022-03-01 DIAGNOSIS — R944 Abnormal results of kidney function studies: Secondary | ICD-10-CM | POA: Diagnosis not present

## 2022-03-01 DIAGNOSIS — N401 Enlarged prostate with lower urinary tract symptoms: Secondary | ICD-10-CM | POA: Diagnosis not present

## 2022-05-06 DIAGNOSIS — R002 Palpitations: Secondary | ICD-10-CM | POA: Insufficient documentation

## 2022-05-06 NOTE — Progress Notes (Signed)
Cardiology Office Note   Date:  05/07/2022   ID:  [name] [name], DOB 11/05/1946, MRN [id number]  PCP:  Celene Squibb, MD  Cardiologist:   Minus Breeding, MD   Chief Complaint  Patient presents with   Fatigue       History of Present Illness: [name] [name] is a 76 y.o. male who presents for evaluation of CAD.   He has a history of non obstructive CAD.  Coronary angiography on 08/03/2004 demonstrated a 50-60% stenosis in the proximal left circumflex coronary artery. A nuclear stress test on 03/19/2018 demonstrated a large prior inferior/inferoseptal myocardial infarction with no evidence of ischemia. Echocardiogram on 03/19/18 showed normal left ventricular systolic function and regional wall motion, LVEF 55%, mild LVH, grade 1 diastolic dysfunction without a wall motion abnormality.   He had evidence of a PFO or secundum ASD.  Pulmonary pressures were normal.  Event monitoring in April 2019 showed no significant arrhythmias with PVCs.   He has had an embolic CVA.    His last perfusion study was in September 2021 and there are actually no reported defects including no suggestion of a prior infarct or current ischemia.    He comes today with his wife.  She is here to make sure he tells me how fatigued he is.  She says he snores and does not sleep well and he is tired all through the day.  I did go back and he had a small amount of sleep apnea in 2021 on a home sleep study but it was suggested that he might need inpatient testing if the symptoms worsen and by their report his symptoms have worsened.  His STOP-BANG score is 5.  He is not having any chest pressure, neck or arm discomfort.  Is not having any palpitations, presyncope or syncope.  He has had no weight gain or edema.   Past Medical History:  Diagnosis Date   BPH (benign prostatic hyperplasia)    CAD (coronary artery disease)    nonobstructive   CVA (cerebral vascular accident) ([place]) 2007   HTN (hypertension)    Patent foramen  ovale     Past Surgical History:  Procedure Laterality Date   COLONOSCOPY  2003   hemorrhoids,otherwise normal.    COLONOSCOPY  09/2010   internal hemorrhoids, next TCS in 09/2015   COLONOSCOPY N/A 07/21/2019   Procedure: COLONOSCOPY;  Surgeon: Daneil Dolin, MD;  Location[facility];  Service: Endoscopy;  Laterality: N/A;  1:15pm-moved up to 12:00 per Mindy   ESOPHAGOGASTRODUODENOSCOPY  02/26/11   esophageal erosions consistent with mild relfux/small HH, antral erosions bx mild chronic gastritis and NO h.pylori   ESOPHAGOGASTRODUODENOSCOPY  09/2010   schatzki ring not manipulated. gastric ulcer with satellite erosions   GALLBLADDER SURGERY     INGUINAL HERNIA REPAIR  2006   left     Current Outpatient Medications  Medication Sig Dispense Refill   atorvastatin (LIPITOR) 20 MG tablet Take 1 tablet (20 mg total) by mouth daily. 90 tablet 3   clopidogrel (PLAVIX) 75 MG tablet Take 1 tablet (75 mg total) by mouth daily. 90 tablet 3   doxazosin (CARDURA) 8 MG tablet TAKE 1 TABLET BY MOUTH AT BEDTIME 90 tablet 0   finasteride (PROSCAR) 5 MG tablet Take 5 mg by mouth daily.       fish oil-omega-3 fatty acids 1000 MG capsule Take 1 g by mouth daily.      Melatonin 10 MG TABS Take 1 tablet by  mouth at bedtime.     nitroGLYCERIN (NITROSTAT) 0.4 MG SL tablet Place 1 tablet (0.4 mg total) under the tongue every 5 (five) minutes as needed. 25 tablet 3   No current facility-administered medications for this visit.    Allergies:   Asa [aspirin]    ROS:  Please see the history of present illness.   Otherwise, review of systems are positive for none.   All other systems are reviewed and negative.    PHYSICAL EXAM: VS:  BP 134/70   Pulse (!) 55   Ht '5\' 10"'$  (1.778 m)   Wt 195 lb 3.2 oz (88.5 kg)   SpO2 94%   BMI 28.01 kg/m  , BMI Body mass index is 28.01 kg/m. GENERAL:  Well appearing NECK:  No jugular venous distention, waveform within normal limits, carotid upstroke brisk and  symmetric, no bruits, no thyromegaly LUNGS:  Clear to auscultation bilaterally CHEST:  Unremarkable HEART:  PMI not displaced or sustained,S1 and S2 within normal limits, no S3, no S4, no clicks, no rubs, no murmurs ABD:  Flat, positive bowel sounds normal in frequency in pitch, no bruits, no rebound, no guarding, no midline pulsatile mass, no hepatomegaly, no splenomegaly EXT:  2 plus pulses throughout, no edema, no cyanosis no clubbing   EKG:  EKG is  ordered today. The ekg ordered 08/12/2020 demonstrates sinus bradycardia, rate 55, axis within normal limits, intervals within normal limits, no acute ST-T wave changes.   Recent Labs: No results found for requested labs within last 8760 hours.    Lipid Panel    Component Value Date/Time   CHOL 162 06/23/2009 2331   TRIG 113 06/23/2009 2331   HDL 47 06/23/2009 2331   CHOLHDL 3.4 Ratio 06/23/2009 2331   VLDL 23 06/23/2009 2331   LDLCALC 92 06/23/2009 2331      Wt Readings from Last 3 Encounters:  05/07/22 195 lb 3.2 oz (88.5 kg)  08/14/21 195 lb (88.5 kg)  08/12/20 188 lb 9.6 oz (85.5 kg)      Other studies Reviewed: Additional studies/ records that were reviewed today include: Previous sleep study, labs. Review of the above records demonstrates:  Please see elsewhere in the note.     ASSESSMENT AND PLAN:  Fatigue:    Given the STOP-BANG score of 5 and mild sleep apnea previously with worsening symptoms he needs an in lab study.  I will order this.  CAD:   He had nonobstructive coronary disease.  No change in therapy.   Palpitations: He is not noticing any palpitations and has no presyncope.  He had no syncope.  No further work-up.  Hyperlipidemia:     LDL was 70.  HDL was 49.  No change in therapy.    History of PFO and CVA:   He has some mild residual memory disorder.  He is on Plavix.  He has an incidental PFO.  No change in therapy.   Hypertension:   His blood pressure is controlled.  No change in  therapy.  Bradycardia: He has had no presyncope or syncope.  He had normal chronotropic competence on his monitor in 2021.  I reviewed this with the patient and his wife.  He is not on any AV nodal blocking agents.  No change in therapy.   Current medicines are reviewed at length with the patient today.  The patient does not have concerns regarding medicines.  The following changes have been made: None  Labs/ tests ordered today include:  Orders Placed This Encounter  Procedures   EKG 12-Lead   Split night study      Disposition:   FU with me in one  year.      Signed, Minus Breeding, MD  05/07/2022 11:55 AM    [facility]

## 2022-05-07 ENCOUNTER — Encounter: Payer: Self-pay | Admitting: Cardiology

## 2022-05-07 ENCOUNTER — Ambulatory Visit (INDEPENDENT_AMBULATORY_CARE_PROVIDER_SITE_OTHER): Payer: PPO | Admitting: Cardiology

## 2022-05-07 VITALS — BP 134/70 | HR 55 | Ht 70.0 in | Wt 195.2 lb

## 2022-05-07 DIAGNOSIS — I2510 Atherosclerotic heart disease of native coronary artery without angina pectoris: Secondary | ICD-10-CM

## 2022-05-07 DIAGNOSIS — R002 Palpitations: Secondary | ICD-10-CM

## 2022-05-07 DIAGNOSIS — E785 Hyperlipidemia, unspecified: Secondary | ICD-10-CM

## 2022-05-07 DIAGNOSIS — R0683 Snoring: Secondary | ICD-10-CM

## 2022-05-07 DIAGNOSIS — I10 Essential (primary) hypertension: Secondary | ICD-10-CM | POA: Diagnosis not present

## 2022-05-07 NOTE — Patient Instructions (Signed)
Medication Instructions:  Your physician recommends that you continue on your current medications as directed. Please refer to the Current Medication list given to you today.  *If you need a refill on your cardiac medications before your next appointment, please call your pharmacy*  Testing/Procedures: Your physician has recommended that you have a sleep study. This test records several body functions during sleep, including: brain activity, eye movement, oxygen and carbon dioxide blood levels, heart rate and rhythm, breathing rate and rhythm, the flow of air through your mouth and nose, snoring, body muscle movements, and chest and belly movement.  Follow-Up: At [facility], you and your health needs are our priority.  As part of our continuing mission to provide you with exceptional heart care, we have created designated Provider Care Teams.  These Care Teams include your primary Cardiologist (physician) and Advanced Practice Providers (APPs -  Physician Assistants and Nurse Practitioners) who all work together to provide you with the care you need, when you need it.  We recommend signing up for the patient portal called "MyChart".  Sign up information is provided on this After Visit Summary.  MyChart is used to connect with patients for Virtual Visits (Telemedicine).  Patients are able to view lab/test results, encounter notes, upcoming appointments, etc.  Non-urgent messages can be sent to your provider as well.   To learn more about what you can do with MyChart, go to [email or web address].    Your next appointment:   12 month(s)  The format for your next appointment:   In Person  Provider:   Minus Breeding, MD {   Important Information About Sugar

## 2022-05-11 ENCOUNTER — Telehealth: Payer: Self-pay | Admitting: *Deleted

## 2022-05-11 NOTE — Telephone Encounter (Signed)
Prior Authorization for Split night sleep study sent to [facility] via Fax.

## 2022-05-23 NOTE — Telephone Encounter (Signed)
PA received from [facility]. PA # [id number]. Valid dates 05/17/22 to 08/15/22.

## 2022-05-28 ENCOUNTER — Other Ambulatory Visit: Payer: Self-pay | Admitting: Urology

## 2022-05-28 DIAGNOSIS — N401 Enlarged prostate with lower urinary tract symptoms: Secondary | ICD-10-CM

## 2022-06-08 ENCOUNTER — Ambulatory Visit: Payer: PPO | Attending: Cardiology | Admitting: Cardiovascular Disease

## 2022-06-08 DIAGNOSIS — I10 Essential (primary) hypertension: Secondary | ICD-10-CM | POA: Diagnosis not present

## 2022-06-08 DIAGNOSIS — R0683 Snoring: Secondary | ICD-10-CM | POA: Diagnosis not present

## 2022-06-08 DIAGNOSIS — I2510 Atherosclerotic heart disease of native coronary artery without angina pectoris: Secondary | ICD-10-CM | POA: Diagnosis not present

## 2022-06-08 DIAGNOSIS — R002 Palpitations: Secondary | ICD-10-CM

## 2022-06-08 DIAGNOSIS — G4761 Periodic limb movement disorder: Secondary | ICD-10-CM | POA: Diagnosis not present

## 2022-06-20 ENCOUNTER — Encounter: Payer: Self-pay | Admitting: Cardiovascular Disease

## 2022-06-20 ENCOUNTER — Telehealth: Payer: Self-pay | Admitting: *Deleted

## 2022-06-20 NOTE — Procedures (Signed)
[place] [facility]         Patient Name: [name] [name], [name] [name] Date: 06/08/2022 Gender: Male D.O.B: Jul 27, 1946 Age (years): 76 Referring Provider: Minus Breeding Height (inches): 70 Interpreting Physician: Shelva Majestic MD, ABSM Weight (lbs): 188 RPSGT: Peak, Robert BMI: 27 MRN: [id number] Neck Size: 15.50  CLINICAL INFORMATION Sleep Study Type: NPSG  Indication for sleep study: fatigue, snoring, h/o mild sleep apnea  Epworth Sleepiness Score: 8  SLEEP STUDY TECHNIQUE As per the AASM Manual for the Scoring of Sleep and Associated Events v2.3 (April 2016) with a hypopnea requiring 4% desaturations.  The channels recorded and monitored were frontal, central and occipital EEG, electrooculogram (EOG), submentalis EMG (chin), nasal and oral airflow, thoracic and abdominal wall motion, anterior tibialis EMG, snore microphone, electrocardiogram, and pulse oximetry.  MEDICATIONS atorvastatin (LIPITOR) 20 MG tablet clopidogrel (PLAVIX) 75 MG tablet doxazosin (CARDURA) 8 MG tablet finasteride (PROSCAR) 5 MG tablet fish oil-omega-3 fatty acids 1000 MG capsule Melatonin 10 MG TABS nitroGLYCERIN (NITROSTAT) 0.4 MG SL tablet  Medications self-administered by patient taken the night of the study : N/A  SLEEP ARCHITECTURE The study was initiated at 10:05:07 PM and ended at 5:33:08 AM.  Sleep onset time was 3.5 minutes and the sleep efficiency was 94.2%. The total sleep time was 422 minutes.  Stage REM latency was 57.0 minutes.  The patient spent 5.69% of the night in stage N1 sleep, 72.04% in stage N2 sleep, 0.00% in stage N3 and 22.3% in REM.  Alpha intrusion was absent.  Supine sleep was 0.00%.  RESPIRATORY PARAMETERS The overall apnea/hypopnea index (AHI) was 2.8 per hour. The respiratory disturbance index (RDI was 3.3/h. There were 10 total apneas, including 0 obstructive, 10 central and 0 mixed apneas. There were 10 hypopneas and 3 RERAs.  The  AHI during Stage REM sleep was 1.9 per hour.  AHI while supine was N/A per hour.  The mean oxygen saturation was 93.96%. The minimum SpO2 during sleep was 89.00%.  Moderate snoring was noted during this study.  CARDIAC DATA The 2 lead EKG demonstrated sinus rhythm. The mean heart rate was 52.32 beats per minute. Other EKG findings include: None.  LEG MOVEMENT DATA The total PLMS were 744 with a resulting PLMS index of 105.78. Associated arousal with leg movement index was 2.3 .  IMPRESSIONS - No significant obstructive sleep apnea occurred during this study (AHI 2.8/h, RDI 3.3/h; REM AHI 1.9/h). Supine sleep was absent. - No significant central sleep apnea occurred during this study (CAI 1.4/h). - Mild oxygen desaturation to a nadir of 89%. - The patient snored with moderate snoring volume. - No cardiac abnormalities were noted during this study. - Severe periodic limb movements of sleep occurred during the study. No significant associated arousals.  DIAGNOSIS - Snoring - Periodic Limb Movement of Sleep  RECOMMENDATIONS - No sleep apnea was present on this study with absent supine sleep. - Effort should be made to optimize nasal and oropharyngeal patency. - Consider alternatives for the treaatment of moderte snoring. - If patient is symptomatic with painful restless legs, consider a trial of pharmacotherapy. - Avoid alcohol, sedatives and other CNS depressants that may worsen sleep apnea and disrupt normal sleep architecture. - Sleep hygiene should be reviewed to assess factors that may improve sleep quality. - Weight management and regular exercise should be  initiated or continued if appropriate.  [Electronically signed] 06/20/2022 10:21 AM  Shelva Majestic MD, [facility], [place], American Board of Sleep Medicine  NPI: [id number]  [place] PH: [phone]   FX: [phone] [place]

## 2022-06-20 NOTE — Telephone Encounter (Signed)
Patient notified of sleep study results. 

## 2022-06-20 NOTE — Telephone Encounter (Signed)
-----   Message from Troy Sine, MD sent at 06/20/2022 10:25 AM EDT ----- Mariann Laster, please notify pt of the results

## 2022-08-25 ENCOUNTER — Other Ambulatory Visit: Payer: Self-pay | Admitting: Cardiology

## 2022-08-25 DIAGNOSIS — I2510 Atherosclerotic heart disease of native coronary artery without angina pectoris: Secondary | ICD-10-CM

## 2022-08-27 ENCOUNTER — Ambulatory Visit (INDEPENDENT_AMBULATORY_CARE_PROVIDER_SITE_OTHER): Payer: PPO | Admitting: Gastroenterology

## 2022-08-27 ENCOUNTER — Encounter: Payer: Self-pay | Admitting: Gastroenterology

## 2022-08-27 VITALS — BP 142/61 | HR 64 | Temp 97.7°F | Ht 70.0 in | Wt 190.0 lb

## 2022-08-27 DIAGNOSIS — K5900 Constipation, unspecified: Secondary | ICD-10-CM | POA: Diagnosis not present

## 2022-08-27 NOTE — Patient Instructions (Signed)
I want you to begin taking the miralax 1 capful twice daily for the next 2 weeks and then you may decrease back to once daily and see how your bowel habits are.  I believe the addition of fiber to your diet with a supplement like benefiber (2-3 teaspoons daily) will also be beneficial.   It is very possible that over the last month, given the extreme heat and humidity that dehydration may be a contributing factor.  I will she did start drinking 1 less Gatorade and consuming a little bit more water to ensure you are hydrated.  Given your family history of colon cancer and colon polyps, sometimes a change in bowel habits could indicate the need to repeat a colonoscopy.  We will trial these other methods to get constipation under control prior to pursuing this.  I will have you follow-up in about 2 to 3 months, please call me with a progress report in a couple weeks and we can adjust follow-up if needed.  It was a pleasure to see you today. I want to create trusting relationships with patients. If you receive a survey regarding your visit,  I greatly appreciate you taking time to fill this out on paper or through your MyChart. I value your feedback.  Venetia Night, MSN, FNP-BC, AGACNP-BC [facility]

## 2022-08-27 NOTE — Progress Notes (Unsigned)
GI Office Note    Referring Provider: Celene Squibb, MD Primary Care Physician:  Celene Squibb, MD  Primary Gastroenterologist: Dr. Gala Romney  Chief Complaint   Chief Complaint  Patient presents with   Constipation    Notice that bowel habits have changed.     History of Present Illness   Latwan Luchsinger is a 76 y.o. male presenting today at the request of Celene Squibb, MD for constipation.  Patient has a family history of colon polyps in his mother and a long history of colon issues in his brother with a kink in his colon that required a colostomy.  There have been previous reports that he had a brother with history of colon cancer in early age however his last visit he was unclear about this.   Colonoscopy in 2011 with internal hemorrhoids, advised repeat in 2016.  Office visit in July 2020.  He denies any constipation, diarrhea, melena, rectal bleeding, abdominal pain or any upper GI symptoms.  Colonoscopy 07/21/2019 -completely normal colonoscopy without any abnormalities.  At this time he was advised no repeat colonoscopy due to age.  Recently seen cardiology for fatigue and to follow-up on palpitations, hyperlipidemia, PFO and CVA as well as hypertension and bradycardia.  No medication changes were made during this visit.  Due to his fatigue it is suspected that this is likely due to sleep apnea and inpatient lab study was ordered.  He was advised to follow-up in 1 year.  Today: Reports he went to [place] recently and did not have a bowel movement for a week and that is not normal for him. He took 3 dulcolax and a packet of miralax and had a bowel movement with diarrhea on Saturday after taking the medications on Friday. Very small bowel movement on Sunday. Has been taking the miralax everyday since Friday. Last bowel movement was this morning and it was diarrhea (small amount). Is having some abdominal pain that is present and dull. Denies bloating or gassiness. No color change in his  stool, denies melena or BRBPR. Denies nausea or vomiting. Denies changes in his diet, decreased activity, or dehydration. Has been building a house and working outside a lot drinking lots of Gatorade. Has incomplete emptying.   Prior to his trip for 3 weeks he feels like his bowel movements have been different - semi diarrhea ([place] 5/6 stools). Used to have bowel movements once to twice daily. Reports a brother of his had a colostomy due to bowel issues.      Current Outpatient Medications  Medication Sig Dispense Refill   atorvastatin (LIPITOR) 20 MG tablet Take 1 tablet (20 mg total) by mouth daily. 90 tablet 3   clopidogrel (PLAVIX) 75 MG tablet Take 1 tablet by mouth once daily 90 tablet 3   doxazosin (CARDURA) 8 MG tablet TAKE 1 TABLET BY MOUTH AT BEDTIME 90 tablet 3   finasteride (PROSCAR) 5 MG tablet Take 5 mg by mouth daily.       Melatonin 10 MG TABS Take 1 tablet by mouth at bedtime.     nitroGLYCERIN (NITROSTAT) 0.4 MG SL tablet Place 1 tablet (0.4 mg total) under the tongue every 5 (five) minutes as needed. 25 tablet 3   fish oil-omega-3 fatty acids 1000 MG capsule Take 1 g by mouth daily.  (Patient not taking: Reported on 08/27/2022)     No current facility-administered medications for this visit.    Past Medical History:  Diagnosis Date   BPH (  benign prostatic hyperplasia)    CAD (coronary artery disease)    nonobstructive   CVA (cerebral vascular accident) ([place]) 2007   HTN (hypertension)    Patent foramen ovale     Past Surgical History:  Procedure Laterality Date   COLONOSCOPY  2003   hemorrhoids,otherwise normal.    COLONOSCOPY  09/2010   internal hemorrhoids, next TCS in 09/2015   COLONOSCOPY N/A 07/21/2019   Procedure: COLONOSCOPY;  Surgeon: Daneil Dolin, MD;  [facility];  Service: Endoscopy;  Laterality: N/A;  1:15pm-moved up to 12:00 per Mindy   ESOPHAGOGASTRODUODENOSCOPY  02/26/11   esophageal erosions consistent with mild relfux/small HH,  antral erosions bx mild chronic gastritis and NO h.pylori   ESOPHAGOGASTRODUODENOSCOPY  09/2010   schatzki ring not manipulated. gastric ulcer with satellite erosions   GALLBLADDER SURGERY     INGUINAL HERNIA REPAIR  2006   left    Family History  Problem Relation Age of Onset   Other Brother        patient had colostomy at young age with long history of colon issues. previously reported possible colon cancer in his 46s but patient not sure about this   Colon polyps Mother        advanced age, died secondary to AAA rupture   COPD Father     Allergies as of 08/27/2022 - Review Complete 08/27/2022  Allergen Reaction Noted   Asa [aspirin] Other (See Comments) 10/08/2014    Social History   Socioeconomic History   Marital status: Married    Spouse name: Not on file   Number of children: Not on file   Years of education: Not on file   Highest education level: Not on file  Occupational History   Occupation: retired from [employer] Use   Smoking status: Never   Smokeless tobacco: Never  Vaping Use   Vaping Use: Never used  Substance and Sexual Activity   Alcohol use: No   Drug use: No   Sexual activity: Not on file  Other Topics Concern   Not on file  Social History Narrative   Not on file   Social Determinants of Health   Financial Resource Strain: Not on file  Food Insecurity: Not on file  Transportation Needs: Not on file  Physical Activity: Not on file  Stress: Not on file  Social Connections: Not on file  Intimate Partner Violence: Not on file     Review of Systems   Gen: Denies any fever, chills, fatigue, weight loss, lack of appetite.  CV: Denies chest pain, heart palpitations, peripheral edema, syncope.  Resp: Denies shortness of breath at rest or with exertion. Denies wheezing or cough.  GI: see HPI GU : Denies urinary burning, urinary frequency, urinary hesitancy MS: Denies joint pain, muscle weakness, cramps, or limitation of movement.   Derm: Denies rash, itching, dry skin Psych: Denies depression, anxiety, memory loss, and confusion Heme: Denies bruising, bleeding, and enlarged lymph nodes.   Physical Exam   BP (!) 142/61 (BP Location: Right Arm, Patient Position: Sitting, Cuff Size: Normal)   Pulse 64   Temp 97.7 F (36.5 C) (Temporal)   Ht '5\' 10"'$  (1.778 m)   Wt 190 lb (86.2 kg)   SpO2 96%   BMI 27.26 kg/m   General:   Alert and oriented. Pleasant and cooperative. Well-nourished and well-developed.  Head:  Normocephalic and atraumatic. Eyes:  Without icterus, sclera clear and conjunctiva pink.  Ears:  Normal auditory acuity.  Mouth:  No deformity or lesions, oral mucosa pink.  Lungs:  Clear to auscultation bilaterally. No wheezes, rales, or rhonchi. No distress.  Heart:  S1, S2 present without murmurs appreciated.  Abdomen:  +BS, soft, non-tender and non-distended. No HSM noted. No guarding or rebound. No masses appreciated.  Rectal:  Deferred  Msk:  Symmetrical without gross deformities. Normal posture. Extremities:  Without edema. Neurologic:  Alert and  oriented x4;  grossly normal neurologically. Skin:  Intact without significant lesions or rashes. Psych:  Alert and cooperative. Normal mood and affect.   Assessment   Tomaz Janis is a 76 y.o. male with a history of BPH, CAD, CVA in 2007, HTN and patent foramen ovale presenting today with constipation.  Constipation: Patient reported change in bowel habits about 3-4 weeks ago stating his bowel movements were different, stools leaning more toward [facility] 5/6 usually small in amount.  Prior to this he reported at least daily if not twice daily soft semiformed bowel movements.  He most recently went to [place] for a week and did not have a bowel movement before he returned home he began taking MiraLAX and took 3 Dulcolax tablets and then had a large loose bowel movement the day after.  He has continue MiraLAX 1 capful daily starting on Friday and reports  small looser bowel movements since then.  Does have some incomplete emptying.  Denies any bloating, gas, melena, hematochezia, nausea, or vomiting.  He does report some lower abdominal dull pain.  Denies nocturnal stools or lack of appetite.  Patient has been building his own house and working out in the heat on a daily basis.  He reports he does drink Gatorade frequently throughout the day but maybe not as much water.  Suspect dehydration and travel may be potential cause of his constipation.  For now we will try Benefiber 2 to 3 teaspoons daily and MiraLAX 1 capful twice daily for couple weeks and monitor for improvement.  Due to his family history of colon cancer, we discussed potentially performing another colonoscopy given his change in bowel habits if they do not improve with MiraLAX.  I have also advised him to drink a little less Gatorade and focus more on water intake   PLAN    Miralax 1 capful twice daily.  Benefiber 2-3 teaspoons daily.  Increase water intake Follow-up in 6 to 8 weeks.   Venetia Night, MSN, FNP-BC, AGACNP-BC [facility]

## 2022-08-30 DIAGNOSIS — N401 Enlarged prostate with lower urinary tract symptoms: Secondary | ICD-10-CM | POA: Diagnosis not present

## 2022-08-30 DIAGNOSIS — R7301 Impaired fasting glucose: Secondary | ICD-10-CM | POA: Diagnosis not present

## 2022-08-30 DIAGNOSIS — E782 Mixed hyperlipidemia: Secondary | ICD-10-CM | POA: Diagnosis not present

## 2022-09-28 DIAGNOSIS — Z0000 Encounter for general adult medical examination without abnormal findings: Secondary | ICD-10-CM | POA: Diagnosis not present

## 2022-09-28 DIAGNOSIS — Z23 Encounter for immunization: Secondary | ICD-10-CM | POA: Diagnosis not present

## 2022-10-15 ENCOUNTER — Other Ambulatory Visit: Payer: Self-pay | Admitting: Cardiology

## 2022-10-15 DIAGNOSIS — E785 Hyperlipidemia, unspecified: Secondary | ICD-10-CM

## 2022-10-27 NOTE — Progress Notes (Incomplete)
GI Office Note    Referring Provider: Celene Squibb, MD Primary Care Physician:  Celene Squibb, MD Primary Gastroenterologist: ***  Date:  10/27/2022  ID:  [name] [name], DOB [birth year], MRN [id number]   Chief Complaint   No chief complaint on file.    History of Present Illness  [name] [name] is a 76 y.o. male with a history of BPH, CAD, CVA in 2007, HTN, patent foramen ovale*** presenting today for follow-up on constipation.  Colonoscopy 07/21/2019: -Normal -No repeat due to age.  Last visit 08/27/2022.  Reported constipation during recent travel.  He did not have vomiting for 1 week which is not normal for him.  Took 3 Dulcolax and MiraLAX and had a bowel movement 24 hours later followed by diarrhea.  Has been taking MiraLAX daily for the last few days with small amounts of diarrhea.  Reported some mild dull abdominal pain.  Denies any changes in his stool or presence of melena or BRBPR.  Reported good appetite.  Having incomplete emptying.  He did note some prior diarrhea about 3 weeks prior to his travel.  Negative family history of  Current Outpatient Medications  Medication Sig Dispense Refill  . atorvastatin (LIPITOR) 20 MG tablet Take 1 tablet by mouth once daily 90 tablet 2  . clopidogrel (PLAVIX) 75 MG tablet Take 1 tablet by mouth once daily 90 tablet 3  . doxazosin (CARDURA) 8 MG tablet TAKE 1 TABLET BY MOUTH AT BEDTIME 90 tablet 3  . finasteride (PROSCAR) 5 MG tablet Take 5 mg by mouth daily.      . Melatonin 10 MG TABS Take 1 tablet by mouth at bedtime.    . nitroGLYCERIN (NITROSTAT) 0.4 MG SL tablet Place 1 tablet (0.4 mg total) under the tongue every 5 (five) minutes as needed. 25 tablet 3   No current facility-administered medications for this visit.    Past Medical History:  Diagnosis Date  . BPH (benign prostatic hyperplasia)   . CAD (coronary artery disease)    nonobstructive  . CVA (cerebral vascular accident) ([place]) 2007  . HTN (hypertension)   . Patent  foramen ovale     Past Surgical History:  Procedure Laterality Date  . COLONOSCOPY  2003   hemorrhoids,otherwise normal.   . COLONOSCOPY  09/2010   internal hemorrhoids, next TCS in 09/2015  . COLONOSCOPY N/A 07/21/2019   Procedure: COLONOSCOPY;  Surgeon: Daneil Dolin, MD;  Location[facility];  Service: Endoscopy;  Laterality: N/A;  1:15pm-moved up to 12:00 per Mindy  . ESOPHAGOGASTRODUODENOSCOPY  02/26/11   esophageal erosions consistent with mild relfux/small HH, antral erosions bx mild chronic gastritis and NO h.pylori  . ESOPHAGOGASTRODUODENOSCOPY  09/2010   schatzki ring not manipulated. gastric ulcer with satellite erosions  . GALLBLADDER SURGERY    . INGUINAL HERNIA REPAIR  2006   left    Family History  Problem Relation Age of Onset  . Other Brother        patient had colostomy at young age with long history of colon issues. previously reported possible colon cancer in his 48s but patient not sure about this  . Colon polyps Mother        advanced age, died secondary to AAA rupture  . COPD Father     Allergies as of 10/29/2022 - Review Complete 08/27/2022  Allergen Reaction Noted  . Asa [aspirin] Other (See Comments) 10/08/2014    Social History   Socioeconomic History  . Marital status: Married  Spouse name: Not on file  . Number of children: Not on file  . Years of education: Not on file  . Highest education level: Not on file  Occupational History  . Occupation: retired from [employer]  . Smoking status: Never  . Smokeless tobacco: Never  Vaping Use  . Vaping Use: Never used  Substance and Sexual Activity  . Alcohol use: No  . Drug use: No  . Sexual activity: Not on file  Other Topics Concern  . Not on file  Social History Narrative  . Not on file   Social Determinants of Health   Financial Resource Strain: Not on file  Food Insecurity: Not on file  Transportation Needs: Not on file  Physical Activity: Not on file   Stress: Not on file  Social Connections: Not on file     Review of Systems   Gen: Denies fever, chills, anorexia. Denies fatigue, weakness, weight loss.  CV: Denies chest pain, palpitations, syncope, peripheral edema, and claudication. Resp: Denies dyspnea at rest, cough, wheezing, coughing up blood, and pleurisy. GI: See HPI Derm: Denies rash, itching, dry skin Psych: Denies depression, anxiety, memory loss, confusion. No homicidal or suicidal ideation.  Heme: Denies bruising, bleeding, and enlarged lymph nodes.   Physical Exam   There were no vitals taken for this visit.  General:   Alert and oriented. No distress noted. Pleasant and cooperative.  Head:  Normocephalic and atraumatic. Eyes:  Conjuctiva clear without scleral icterus. Mouth:  Oral mucosa pink and moist. Good dentition. No lesions. Lungs:  Clear to auscultation bilaterally. No wheezes, rales, or rhonchi. No distress.  Heart:  S1, S2 present without murmurs appreciated.  Abdomen:  +BS, soft, non-tender and non-distended. No rebound or guarding. No HSM or masses noted. Rectal: *** Msk:  Symmetrical without gross deformities. Normal posture. Extremities:  Without edema. Neurologic:  Alert and  oriented x4 Psych:  Alert and cooperative. Normal mood and affect.   Assessment  [name] [name] is a 76 y.o. male with a history of *** presenting today with    PLAN   ***     Venetia Night, MSN, FNP-BC, AGACNP-BC [facility]

## 2022-10-27 NOTE — Progress Notes (Deleted)
GI Office Note    Referring Provider: Celene Squibb, MD Primary Care Physician:  Celene Squibb, MD Primary Gastroenterologist: Cristopher Estimable.Rourk, MD  Date:  10/27/2022  ID:  [name], DOB 22-Jun-1946, MRN [id number]   Chief Complaint   No chief complaint on file.   History of Present Illness  [name] [name] is a 76 y.o. male with a history of BPH, CAD, CVA in 2007, HTN, patent foramen ovale*** presenting today for follow-up on constipation.  Colonoscopy 07/21/2019: -Normal -No repeat due to age.  Last visit 08/27/2022.  Reported constipation during recent travel.  He did not have vomiting for 1 week which is not normal for him.  Took 3 Dulcolax and MiraLAX and had a bowel movement 24 hours later followed by diarrhea.  Has been taking MiraLAX daily for the last few days with small amounts of diarrhea.  Reported some mild dull abdominal pain.  Denies any changes in his stool or presence of melena or BRBPR.  Reported good appetite.  Having incomplete emptying.  He did note some prior diarrhea about 3 weeks prior to his travel.  Negative family history of his brother having a colostomy for bowel issues.  Advised to continue MiraLAX 1 capful but increase to twice daily and add Benefiber 2-3 teaspoons daily.  Advised to increase water intake and consideration for colonoscopy if bowel habits not improved with MiraLAX.   Today:   Current Outpatient Medications  Medication Sig Dispense Refill   atorvastatin (LIPITOR) 20 MG tablet Take 1 tablet by mouth once daily 90 tablet 2   clopidogrel (PLAVIX) 75 MG tablet Take 1 tablet by mouth once daily 90 tablet 3   doxazosin (CARDURA) 8 MG tablet TAKE 1 TABLET BY MOUTH AT BEDTIME 90 tablet 3   finasteride (PROSCAR) 5 MG tablet Take 5 mg by mouth daily.       Melatonin 10 MG TABS Take 1 tablet by mouth at bedtime.     nitroGLYCERIN (NITROSTAT) 0.4 MG SL tablet Place 1 tablet (0.4 mg total) under the tongue every 5 (five) minutes as needed. 25 tablet 3    No current facility-administered medications for this visit.    Past Medical History:  Diagnosis Date   BPH (benign prostatic hyperplasia)    CAD (coronary artery disease)    nonobstructive   CVA (cerebral vascular accident) ([place]) 2007   HTN (hypertension)    Patent foramen ovale     Past Surgical History:  Procedure Laterality Date   COLONOSCOPY  2003   hemorrhoids,otherwise normal.    COLONOSCOPY  09/2010   internal hemorrhoids, next TCS in 09/2015   COLONOSCOPY N/A 07/21/2019   Procedure: COLONOSCOPY;  Surgeon: Daneil Dolin, MD;  [facility];  Service: Endoscopy;  Laterality: N/A;  1:15pm-moved up to 12:00 per Mindy   ESOPHAGOGASTRODUODENOSCOPY  02/26/11   esophageal erosions consistent with mild relfux/small HH, antral erosions bx mild chronic gastritis and NO h.pylori   ESOPHAGOGASTRODUODENOSCOPY  09/2010   schatzki ring not manipulated. gastric ulcer with satellite erosions   GALLBLADDER SURGERY     INGUINAL HERNIA REPAIR  2006   left    Family History  Problem Relation Age of Onset   Other Brother        patient had colostomy at young age with long history of colon issues. previously reported possible colon cancer in his 44s but patient not sure about this   Colon polyps Mother        advanced age,  died secondary to AAA rupture   COPD Father     Allergies as of 10/29/2022 - Review Complete 08/27/2022  Allergen Reaction Noted   Asa [aspirin] Other (See Comments) 10/08/2014    Social History   Socioeconomic History   Marital status: Married    Spouse name: Not on file   Number of children: Not on file   Years of education: Not on file   Highest education level: Not on file  Occupational History   Occupation: retired from [employer]   Smoking status: Never   Smokeless tobacco: Never  Vaping Use   Vaping Use: Never used  Substance and Sexual Activity   Alcohol use: No   Drug use: No   Sexual activity: Not on file   Other Topics Concern   Not on file  Social History Narrative   Not on file   Social Determinants of Health   Financial Resource Strain: Not on file  Food Insecurity: Not on file  Transportation Needs: Not on file  Physical Activity: Not on file  Stress: Not on file  Social Connections: Not on file     Review of Systems   Gen: Denies fever, chills, anorexia. Denies fatigue, weakness, weight loss.  CV: Denies chest pain, palpitations, syncope, peripheral edema, and claudication. Resp: Denies dyspnea at rest, cough, wheezing, coughing up blood, and pleurisy. GI: See HPI Derm: Denies rash, itching, dry skin Psych: Denies depression, anxiety, memory loss, confusion. No homicidal or suicidal ideation.  Heme: Denies bruising, bleeding, and enlarged lymph nodes.   Physical Exam   There were no vitals taken for this visit.  General:   Alert and oriented. No distress noted. Pleasant and cooperative.  Head:  Normocephalic and atraumatic. Eyes:  Conjuctiva clear without scleral icterus. Mouth:  Oral mucosa pink and moist. Good dentition. No lesions. Lungs:  Clear to auscultation bilaterally. No wheezes, rales, or rhonchi. No distress.  Heart:  S1, S2 present without murmurs appreciated.  Abdomen:  +BS, soft, non-tender and non-distended. No rebound or guarding. No HSM or masses noted. Rectal: *** Msk:  Symmetrical without gross deformities. Normal posture. Extremities:  Without edema. Neurologic:  Alert and  oriented x4 Psych:  Alert and cooperative. Normal mood and affect.   Assessment  [name] [name] is a 76 y.o. male with a history of BPH, CAD, CVA in 2007, HTN, patent foramen ovale*** presenting today for follow-up on constipation.  Constipation:   PLAN   ***     Venetia Night, MSN, FNP-BC, AGACNP-BC [facility]

## 2022-10-29 ENCOUNTER — Ambulatory Visit: Payer: PPO | Admitting: Gastroenterology

## 2022-11-05 NOTE — Progress Notes (Unsigned)
GI Office Note    Referring Provider: Celene Squibb, MD Primary Care Physician:  Celene Squibb, MD Primary Gastroenterologist: Cristopher Estimable.Rourk, MD  Date:  11/06/2022  ID:  [name] [name], DOB Mar 18, 1946, MRN [id number]   Chief Complaint   Chief Complaint  Patient presents with   Follow-up   History of Present Illness  [name] [name] is a 76 y.o. male with a history of BPH, CAD, CVA in 2007, HTN, and PFO (patent foramen ovale) presenting today for follow-up on constipation/change in bowel habits.  Colonoscopy in August 2020 normal without any abnormalities.  Advised no repeat due to age.  Patient last seen 08/27/2022 and reported that since he traveled to [place] he did not have a bowel movement for a week.  Reported this is not normal for him.  Had 3 Dulcolax and a pack of MiraLAX and had diarrhea the following day and very small bowel movement 2 days after.  Reported multiple days of MiraLAX with last bowel movement being morning prior to visit with small amount of loose stool.  Did report some double abdominal pain.  Denied bloating, flatulence, melena, BRBPR, nausea, or vomiting.  Denied any recent changes in his diet.  Does admit to a lot of stress and had been working outside a lot recently in the heat and drinking a lot of Gatorade and not as much water.  Also reported incomplete emptying.  Noted that for about 3 weeks prior to his trip he also thought his bowel movements were different with having some looser stools.  Advised MiraLAX 1 capful twice daily as well as Benefiber.  Also advised increase water intake.  Discussed performing colonoscopy for change in bowel habits if no improvement with MiraLAX, fiber, and hydration.   Today: Took miralax for about a week and then has been normal since then. No abdominal pain. No diarrhea. No melena, BRBPR, lack of appetite, early satiety, weight loss, dysphagia, reflux. . Having normal regular daily Bms.   Inside painting his house and  hopefully getting floors done. Should be done with building his house by Christmas.  Denies headaches, shortness of breath, or chest pains. Does not check BP at home.    Current Outpatient Medications  Medication Sig Dispense Refill   atorvastatin (LIPITOR) 20 MG tablet Take 1 tablet by mouth once daily 90 tablet 2   clopidogrel (PLAVIX) 75 MG tablet Take 1 tablet by mouth once daily 90 tablet 3   doxazosin (CARDURA) 8 MG tablet TAKE 1 TABLET BY MOUTH AT BEDTIME 90 tablet 3   finasteride (PROSCAR) 5 MG tablet Take 5 mg by mouth daily.       Melatonin 10 MG TABS Take 1 tablet by mouth at bedtime.     nitroGLYCERIN (NITROSTAT) 0.4 MG SL tablet Place 1 tablet (0.4 mg total) under the tongue every 5 (five) minutes as needed. 25 tablet 3   No current facility-administered medications for this visit.    Past Medical History:  Diagnosis Date   BPH (benign prostatic hyperplasia)    CAD (coronary artery disease)    nonobstructive   CVA (cerebral vascular accident) ([place]) 2007   HTN (hypertension)    Patent foramen ovale     Past Surgical History:  Procedure Laterality Date   COLONOSCOPY  2003   hemorrhoids,otherwise normal.    COLONOSCOPY  09/2010   internal hemorrhoids, next TCS in 09/2015   COLONOSCOPY N/A 07/21/2019   Procedure: COLONOSCOPY;  Surgeon: Daneil Dolin, MD;  [facility];  Service: Endoscopy;  Laterality: N/A;  1:15pm-moved up to 12:00 per Mindy   ESOPHAGOGASTRODUODENOSCOPY  02/26/11   esophageal erosions consistent with mild relfux/small HH, antral erosions bx mild chronic gastritis and NO h.pylori   ESOPHAGOGASTRODUODENOSCOPY  09/2010   schatzki ring not manipulated. gastric ulcer with satellite erosions   GALLBLADDER SURGERY     INGUINAL HERNIA REPAIR  2006   left    Family History  Problem Relation Age of Onset   Other Brother        patient had colostomy at young age with long history of colon issues. previously reported possible colon cancer in his  63s but patient not sure about this   Colon polyps Mother        advanced age, died secondary to AAA rupture   COPD Father     Allergies as of 11/06/2022 - Review Complete 11/06/2022  Allergen Reaction Noted   Asa [aspirin] Other (See Comments) 10/08/2014    Social History   Socioeconomic History   Marital status: Married    Spouse name: Not on file   Number of children: Not on file   Years of education: Not on file   Highest education level: Not on file  Occupational History   Occupation: retired from [employer] Use   Smoking status: Never   Smokeless tobacco: Never  Vaping Use   Vaping Use: Never used  Substance and Sexual Activity   Alcohol use: No   Drug use: No   Sexual activity: Not on file  Other Topics Concern   Not on file  Social History Narrative   Not on file   Social Determinants of Health   Financial Resource Strain: Not on file  Food Insecurity: Not on file  Transportation Needs: Not on file  Physical Activity: Not on file  Stress: Not on file  Social Connections: Not on file     Review of Systems   Gen: Denies fever, chills, anorexia. Denies fatigue, weakness, weight loss.  CV: Denies chest pain, palpitations, syncope, peripheral edema, and claudication. Resp: Denies dyspnea at rest, cough, wheezing, coughing up blood, and pleurisy. GI: See HPI Derm: Denies rash, itching, dry skin Psych: Denies depression, anxiety, memory loss, confusion. No homicidal or suicidal ideation.  Heme: Denies bruising, bleeding, and enlarged lymph nodes.   Physical Exam   BP 137/74   Pulse 62   Temp 97.9 F (36.6 C) (Temporal)   Ht '5\' 10"'$  (1.778 m)   Wt 192 lb 12.8 oz (87.5 kg)   SpO2 96%   BMI 27.66 kg/m   General:   Alert and oriented. No distress noted. Pleasant and cooperative.  Head:  Normocephalic and atraumatic. Eyes:  Conjuctiva clear without scleral icterus. Mouth:  Oral mucosa pink and moist. Good dentition. No lesions. Lungs:   Clear to auscultation bilaterally. No wheezes, rales, or rhonchi. No distress.  Heart:  S1, S2 present without murmurs appreciated.  Abdomen:  +BS, soft, non-tender and non-distended. No rebound or guarding. No HSM or masses noted. Rectal: deferred Msk:  Symmetrical without gross deformities. Normal posture. Extremities:  Without edema. Neurologic:  Alert and  oriented x4 Psych:  Alert and cooperative. Normal mood and affect.   Assessment  [name] [name] is a 76 y.o. male with a history of BPH, CAD, CVA in 2007, HTN, and PFO (patent foramen ovale) presenting today for follow-up on constipation/change in bowel habits.  Constipation: Resolved. Took miralax once to twice daily for about 1 week  and increased water intake and constipation improved.  Currently without any issue, having regular soft daily bowel movements.  No alarm symptoms present.  No other upper or lower GI symptoms.  Advise continuing to stay hydrated and may use MiraLAX or stool softener over-the-counter as needed.  Elevated BP: The patient was found to have elevated blood pressure when vital signs were checked in the office (157/66). The blood pressure was rechecked by the nursing staff and it was found be improved at 134/74. I personally advised to the patient to follow up closely with his PCP for hypertension control if BP remains elevated on a regular basis.    PLAN   Follow up with PCP regarding BP if remains elevated. Heart healhty diet.  Miralax 17g or stool softener as needed Follow up as needed.     Venetia Night, MSN, FNP-BC, AGACNP-BC [facility]

## 2022-11-06 ENCOUNTER — Encounter: Payer: Self-pay | Admitting: Gastroenterology

## 2022-11-06 ENCOUNTER — Ambulatory Visit: Payer: PPO | Admitting: Gastroenterology

## 2022-11-06 VITALS — BP 137/74 | HR 62 | Temp 97.9°F | Ht 70.0 in | Wt 192.8 lb

## 2022-11-06 DIAGNOSIS — R030 Elevated blood-pressure reading, without diagnosis of hypertension: Secondary | ICD-10-CM

## 2022-11-06 DIAGNOSIS — K5900 Constipation, unspecified: Secondary | ICD-10-CM

## 2022-11-06 NOTE — Patient Instructions (Addendum)
Continue stay hydrated with at least 4 to 6 glasses of water daily.  This is extremely important during the summers and in high heat and while traveling.  You may use MiraLAX 17 g as needed or stool softener as needed if constipation returns.  Please follow-up if you begin to have another significant change in bowel habits or if you begin to experience any black/dark stools or bright red blood in your stools.  Your blood pressure has been elevated today at our office, I would recommend you checking it at least once or twice weekly at home and if it remains elevated greater than 140/90 then follow-up with your primary care for management.  I hope you have a wonderful Thanksgiving, Christmas, and New Year!  It was a pleasure to see you today. I want to create trusting relationships with patients. If you receive a survey regarding your visit,  I greatly appreciate you taking time to fill this out on paper or through your MyChart. I value your feedback.  Venetia Night, MSN, FNP-BC, AGACNP-BC [facility]

## 2022-11-27 ENCOUNTER — Encounter: Payer: Self-pay | Admitting: Urology

## 2022-11-27 ENCOUNTER — Ambulatory Visit (INDEPENDENT_AMBULATORY_CARE_PROVIDER_SITE_OTHER): Payer: PPO | Admitting: Urology

## 2022-11-27 VITALS — BP 169/82 | HR 68 | Ht 70.0 in | Wt 190.0 lb

## 2022-11-27 DIAGNOSIS — N5201 Erectile dysfunction due to arterial insufficiency: Secondary | ICD-10-CM | POA: Diagnosis not present

## 2022-11-27 DIAGNOSIS — R351 Nocturia: Secondary | ICD-10-CM

## 2022-11-27 DIAGNOSIS — R350 Frequency of micturition: Secondary | ICD-10-CM

## 2022-11-27 LAB — BLADDER SCAN AMB NON-IMAGING: Scan Result: 99

## 2022-11-27 MED ORDER — SILDENAFIL CITRATE 100 MG PO TABS: 100.0000 mg | ORAL_TABLET | Freq: Every day | ORAL | 99 refills | Status: AC | PRN

## 2022-11-27 NOTE — Progress Notes (Signed)
post void residual=45m

## 2022-11-27 NOTE — Progress Notes (Signed)
History of Present Illness: 76 year old male presents today for further follow-up.  Last seen 2-1/2 years ago for BPH.  He is on doxazosin and finasteride.  Despite that, he still has significant lower urinary tract symptoms, mainly at night.  At his last visit he had similar issues with nocturia-usually 4-5 times.  He does snore.  He has had sleep studies that reveal he does not have sleep apnea.  During the day he has a good stream, feels like he empties well and does not have frequency or urgency.  At last physical exam he was felt to have a 60 g gland.  He does have normal PSAs when checked with Dr. [name].  Past Medical History:  Diagnosis Date   BPH (benign prostatic hyperplasia)    CAD (coronary artery disease)    nonobstructive   CVA (cerebral vascular accident) ([place]) 2007   HTN (hypertension)    Patent foramen ovale     Past Surgical History:  Procedure Laterality Date   COLONOSCOPY  2003   hemorrhoids,otherwise normal.    COLONOSCOPY  09/2010   internal hemorrhoids, next TCS in 09/2015   COLONOSCOPY N/A 07/21/2019   Procedure: COLONOSCOPY;  Surgeon: Daneil Dolin, MD;  [facility];  Service: Endoscopy;  Laterality: N/A;  1:15pm-moved up to 12:00 per Mindy   ESOPHAGOGASTRODUODENOSCOPY  02/26/11   esophageal erosions consistent with mild relfux/small HH, antral erosions bx mild chronic gastritis and NO h.pylori   ESOPHAGOGASTRODUODENOSCOPY  09/2010   schatzki ring not manipulated. gastric ulcer with satellite erosions   GALLBLADDER SURGERY     INGUINAL HERNIA REPAIR  2006   left    Home Medications:  Allergies as of 11/27/2022       Reactions   Asa [aspirin] Other (See Comments)   Caused ulcer in stomach / GI bleeding         Medication List        Accurate as of November 27, 2022  4:15 PM. If you have any questions, ask your nurse or doctor.          atorvastatin 20 MG tablet Commonly known as: LIPITOR Take 1 tablet by mouth once daily    clopidogrel 75 MG tablet Commonly known as: PLAVIX Take 1 tablet by mouth once daily   doxazosin 8 MG tablet Commonly known as: CARDURA TAKE 1 TABLET BY MOUTH AT BEDTIME   finasteride 5 MG tablet Commonly known as: PROSCAR Take 5 mg by mouth daily.   Melatonin 10 MG Tabs Take 1 tablet by mouth at bedtime.   nitroGLYCERIN 0.4 MG SL tablet Commonly known as: NITROSTAT Place 1 tablet (0.4 mg total) under the tongue every 5 (five) minutes as needed.        Allergies:  Allergies  Allergen Reactions   Asa [Aspirin] Other (See Comments)    Caused ulcer in stomach / GI bleeding     Family History  Problem Relation Age of Onset   Other Brother        patient had colostomy at young age with long history of colon issues. previously reported possible colon cancer in his 39s but patient not sure about this   Colon polyps Mother        advanced age, died secondary to AAA rupture   COPD Father     Social History:  reports that he has never smoked. He has never used smokeless tobacco. He reports that he does not drink alcohol and does not use drugs.  ROS: A complete review of systems was performed.  All systems are negative except for pertinent findings as noted.  Physical Exam:  Vital signs in last 24 hours: BP (!) 169/82   Pulse 68   Ht '5\' 10"'$  (1.778 m)   Wt 190 lb (86.2 kg)   BMI 27.26 kg/m  Constitutional:  Alert and oriented, No acute distress Cardiovascular: Regular rate  Respiratory: Normal respiratory effort Neurologic: Grossly intact, no focal deficits Psychiatric: Normal mood and affect  I have reviewed prior pt notes  I have reviewed urinalysis results  I have independently reviewed bladder scan-residual urine volume under 100 mL  I have reviewed prior PSA results     Impression/Assessment:  BPH with lower urinary tract symptoms.  On dual medical therapy  Nocturia, biggest issue  ED  Plan:  1.  I discussed limiting afternoon and evening  fluids, perhaps wearing compression socks  2.  He was given a prescription for sildenafil  3.  I will have him come back in a month to recheck his symptoms-he was told to record his voiding for 3 nights

## 2022-11-28 LAB — URINALYSIS, ROUTINE W REFLEX MICROSCOPIC
Bilirubin, UA: NEGATIVE
Glucose, UA: NEGATIVE
Ketones, UA: NEGATIVE
Leukocytes,UA: NEGATIVE
Nitrite, UA: NEGATIVE
Protein,UA: NEGATIVE
RBC, UA: NEGATIVE
Specific Gravity, UA: 1.025 (ref 1.005–1.030)
Urobilinogen, Ur: 0.2 mg/dL (ref 0.2–1.0)
pH, UA: 5 (ref 5.0–7.5)

## 2023-01-01 ENCOUNTER — Ambulatory Visit (INDEPENDENT_AMBULATORY_CARE_PROVIDER_SITE_OTHER): Payer: PPO | Admitting: Urology

## 2023-01-01 VITALS — BP 142/72 | HR 66

## 2023-01-01 DIAGNOSIS — R351 Nocturia: Secondary | ICD-10-CM

## 2023-01-01 DIAGNOSIS — R350 Frequency of micturition: Secondary | ICD-10-CM

## 2023-01-01 DIAGNOSIS — N5201 Erectile dysfunction due to arterial insufficiency: Secondary | ICD-10-CM

## 2023-01-01 MED ORDER — DESMOPRESSIN ACETATE 0.2 MG PO TABS: ORAL_TABLET | ORAL | 11 refills | Status: DC

## 2023-01-01 NOTE — Progress Notes (Signed)
History of Present Illness: 77 year old male presents today for further follow-up.  He is on doxazosin and finasteride.  Despite that, he still has significant lower urinary tract symptoms, mainly at night.  At his last visit he had similar issues with nocturia-usually 4-5 times.   He does snore.  He has had sleep studies that reveal he does not have sleep apnea.  During the day he has a good stream, feels like he empties well and does not have frequency or urgency.   At last physical exam he was felt to have a 60 g gland.  He does have normal PSAs when checked with Dr. [name].  1.16.2024: Here for f/u of a visit 1 mo ago. His nocturia was addressed with limiting pm fluids, using compression stockings. He brings in a nightttime voiding diary.  His voiding diary was performed January 2, third and fourth.  On the second, he had 3 voiding episodes totaling 28 ounces.  On the third, he had 4 voiding episodes totaling 32 ounces and on the fourth he had 4 voiding episodes totaling 30 ounces.  Past Medical History:  Diagnosis Date   BPH (benign prostatic hyperplasia)    CAD (coronary artery disease)    nonobstructive   CVA (cerebral vascular accident) ([place]) 2007   HTN (hypertension)    Patent foramen ovale     Past Surgical History:  Procedure Laterality Date   COLONOSCOPY  2003   hemorrhoids,otherwise normal.    COLONOSCOPY  09/2010   internal hemorrhoids, next TCS in 09/2015   COLONOSCOPY N/A 07/21/2019   Procedure: COLONOSCOPY;  Surgeon: Daneil Dolin, MD;  [facility];  Service: Endoscopy;  Laterality: N/A;  1:15pm-moved up to 12:00 per Mindy   ESOPHAGOGASTRODUODENOSCOPY  02/26/11   esophageal erosions consistent with mild relfux/small HH, antral erosions bx mild chronic gastritis and NO h.pylori   ESOPHAGOGASTRODUODENOSCOPY  09/2010   schatzki ring not manipulated. gastric ulcer with satellite erosions   GALLBLADDER SURGERY     INGUINAL HERNIA REPAIR  2006   left    Home  Medications:  Allergies as of 01/01/2023       Reactions   Asa [aspirin] Other (See Comments)   Caused ulcer in stomach / GI bleeding         Medication List        Accurate as of January 01, 2023  6:40 AM. If you have any questions, ask your nurse or doctor.          atorvastatin 20 MG tablet Commonly known as: LIPITOR Take 1 tablet by mouth once daily   clopidogrel 75 MG tablet Commonly known as: PLAVIX Take 1 tablet by mouth once daily   doxazosin 8 MG tablet Commonly known as: CARDURA TAKE 1 TABLET BY MOUTH AT BEDTIME   finasteride 5 MG tablet Commonly known as: PROSCAR Take 5 mg by mouth daily.   Melatonin 10 MG Tabs Take 1 tablet by mouth at bedtime.   nitroGLYCERIN 0.4 MG SL tablet Commonly known as: NITROSTAT Place 1 tablet (0.4 mg total) under the tongue every 5 (five) minutes as needed.   sildenafil 100 MG tablet Commonly known as: VIAGRA Take 1 tablet (100 mg total) by mouth daily as needed for erectile dysfunction.        Allergies:  Allergies  Allergen Reactions   Asa [Aspirin] Other (See Comments)    Caused ulcer in stomach / GI bleeding     Family History  Problem Relation Age of Onset  Other Brother        patient had colostomy at young age with long history of colon issues. previously reported possible colon cancer in his 11s but patient not sure about this   Colon polyps Mother        advanced age, died secondary to AAA rupture   COPD Father     Social History:  reports that he has never smoked. He has never used smokeless tobacco. He reports that he does not drink alcohol and does not use drugs.  ROS: A complete review of systems was performed.  All systems are negative except for pertinent findings as noted.  Physical Exam:  Vital signs in last 24 hours: There were no vitals taken for this visit. Constitutional:  Alert and oriented, No acute distress Cardiovascular: Regular rate  Respiratory: Normal respiratory  effort Psychiatric: Normal mood and affect  I have reviewed prior pt notes  I have reviewed urinalysis results  I have independently reviewed voiding diary  I have reviewed prior PSA results  I have reviewed IPSS  sheet   Impression/Assessment:  BPH, with nocturia which is quite bothersome for the patient  Plan:  I offered him desmopressin.  Side effects were discussed.  He would like to try this.  I will start him on 0.2 mg nightly, he can titrate up to 3 tablets at a time  I will have him drop in in about a week for sodium level.  I will see him back in about 3 months for recheck

## 2023-01-02 LAB — URINALYSIS, ROUTINE W REFLEX MICROSCOPIC
Bilirubin, UA: NEGATIVE
Glucose, UA: NEGATIVE
Ketones, UA: NEGATIVE
Leukocytes,UA: NEGATIVE
Nitrite, UA: NEGATIVE
RBC, UA: NEGATIVE
Specific Gravity, UA: 1.025 (ref 1.005–1.030)
Urobilinogen, Ur: 0.2 mg/dL (ref 0.2–1.0)
pH, UA: 5 (ref 5.0–7.5)

## 2023-01-28 DIAGNOSIS — H578A2 Foreign body sensation, left eye: Secondary | ICD-10-CM | POA: Diagnosis not present

## 2023-01-28 DIAGNOSIS — H16142 Punctate keratitis, left eye: Secondary | ICD-10-CM | POA: Diagnosis not present

## 2023-01-30 DIAGNOSIS — M9901 Segmental and somatic dysfunction of cervical region: Secondary | ICD-10-CM | POA: Diagnosis not present

## 2023-01-30 DIAGNOSIS — M436 Torticollis: Secondary | ICD-10-CM | POA: Diagnosis not present

## 2023-01-30 DIAGNOSIS — M47812 Spondylosis without myelopathy or radiculopathy, cervical region: Secondary | ICD-10-CM | POA: Diagnosis not present

## 2023-01-31 DIAGNOSIS — M47812 Spondylosis without myelopathy or radiculopathy, cervical region: Secondary | ICD-10-CM | POA: Diagnosis not present

## 2023-01-31 DIAGNOSIS — M436 Torticollis: Secondary | ICD-10-CM | POA: Diagnosis not present

## 2023-01-31 DIAGNOSIS — M9901 Segmental and somatic dysfunction of cervical region: Secondary | ICD-10-CM | POA: Diagnosis not present

## 2023-02-01 DIAGNOSIS — M47812 Spondylosis without myelopathy or radiculopathy, cervical region: Secondary | ICD-10-CM | POA: Diagnosis not present

## 2023-02-01 DIAGNOSIS — M9901 Segmental and somatic dysfunction of cervical region: Secondary | ICD-10-CM | POA: Diagnosis not present

## 2023-02-01 DIAGNOSIS — M436 Torticollis: Secondary | ICD-10-CM | POA: Diagnosis not present

## 2023-04-02 ENCOUNTER — Ambulatory Visit: Payer: PPO | Admitting: Urology

## 2023-06-04 ENCOUNTER — Other Ambulatory Visit: Payer: Self-pay | Admitting: Urology

## 2023-06-04 DIAGNOSIS — N401 Enlarged prostate with lower urinary tract symptoms: Secondary | ICD-10-CM

## 2023-07-18 ENCOUNTER — Other Ambulatory Visit: Payer: Self-pay | Admitting: Cardiology

## 2023-07-18 DIAGNOSIS — E785 Hyperlipidemia, unspecified: Secondary | ICD-10-CM

## 2023-08-21 ENCOUNTER — Other Ambulatory Visit: Payer: Self-pay

## 2023-08-21 DIAGNOSIS — E785 Hyperlipidemia, unspecified: Secondary | ICD-10-CM

## 2023-08-21 MED ORDER — ATORVASTATIN CALCIUM 20 MG PO TABS: 20.0000 mg | ORAL_TABLET | Freq: Every day | ORAL | 0 refills | Status: DC

## 2023-08-23 ENCOUNTER — Telehealth: Payer: Self-pay | Admitting: Cardiology

## 2023-08-23 DIAGNOSIS — E785 Hyperlipidemia, unspecified: Secondary | ICD-10-CM

## 2023-08-23 MED ORDER — ATORVASTATIN CALCIUM 20 MG PO TABS: 20.0000 mg | ORAL_TABLET | Freq: Every day | ORAL | 0 refills | Status: DC

## 2023-08-23 NOTE — Telephone Encounter (Signed)
Pt's medication was sent to pt's pharmacy as requested. Confirmation received.  °

## 2023-08-23 NOTE — Telephone Encounter (Signed)
*  STAT* If patient is at the pharmacy, call can be transferred to refill team.   1. Which medications need to be refilled? (please list name of each medication and dose if known)           atorvastatin (LIPITOR) 20 MG tablet     2. Which pharmacy/location (including street and city if local pharmacy) is medication to be sent to? [id number] - [place], [place] - [address]    3. Do they need a 30 day or 90 day supply? 90 day

## 2023-08-25 DIAGNOSIS — R001 Bradycardia, unspecified: Secondary | ICD-10-CM | POA: Insufficient documentation

## 2023-08-25 NOTE — Progress Notes (Deleted)
Cardiology Office Note:   Date:  08/25/2023  ID:  [name] [name], DOB 05-Dec-1946, MRN [id number] PCP: Benita Stabile, MD  [facility] Providers Cardiologist:  Rollene Rotunda, MD {  History of Present Illness:   [name] [name] is a 77 y.o. male  who presents for evaluation of CAD.   He has a history of non obstructive CAD.  Coronary angiography on 08/03/2004 demonstrated a 50-60% stenosis in the proximal left circumflex coronary artery. A nuclear stress test on 03/19/2018 demonstrated a large prior inferior/inferoseptal myocardial infarction with no evidence of ischemia. Echocardiogram on 03/19/18 showed normal left ventricular systolic function and regional wall motion, LVEF 55%, mild LVH, grade 1 diastolic dysfunction without a wall motion abnormality.   He had evidence of a PFO or secundum ASD.  Pulmonary pressures were normal.  Event monitoring in April 2019 showed no significant arrhythmias with PVCs.   He has had an embolic CVA.    His last perfusion study was in September 2021 and there are actually no reported defects including no suggestion of a prior infarct or current ischemia.     ***  ***  He comes today with his wife.  She is here to make sure he tells me how fatigued he is.  She says he snores and does not sleep well and he is tired all through the day.  I did go back and he had a small amount of sleep apnea in 2021 on a home sleep study but it was suggested that he might need inpatient testing if the symptoms worsen and by their report his symptoms have worsened.  His STOP-BANG score is 5.  He is not having any chest pressure, neck or arm discomfort.  Is not having any palpitations, presyncope or syncope.  He has had no weight gain or edema.  ROS: ***  Studies Reviewed:    EKG:       ***  Risk Assessment/Calculations:   {Does this patient have ATRIAL FIBRILLATION?:[id number]} No BP recorded.  {Refresh Note OR Click here to enter BP  :1}***        Physical Exam:   VS:   There were no vitals taken for this visit.   Wt Readings from Last 3 Encounters:  11/27/22 190 lb (86.2 kg)  11/06/22 192 lb 12.8 oz (87.5 kg)  08/27/22 190 lb (86.2 kg)     GEN: Well nourished, well developed in no acute distress NECK: No JVD; No carotid bruits CARDIAC: ***RRR, no murmurs, rubs, gallops RESPIRATORY:  Clear to auscultation without rales, wheezing or rhonchi  ABDOMEN: Soft, non-tender, non-distended EXTREMITIES:  No edema; No deformity   ASSESSMENT AND PLAN:   Fatigue:    He had a negative sleep study.  ***  Given the STOP-BANG score of 5 and mild sleep apnea previously with worsening symptoms he needs an in lab study.  I will order this.   CAD:   He had nonobstructive coronary disease.   *** No change in therapy.   Palpitations: He is not noticing any palpitations and has no presyncope.  He had no syncope.  No further work-up.   Hyperlipidemia:     LDL was *** 70.  HDL was 49.  No change in therapy.    History of PFO and CVA:  He has an incidental PFO.  ***  No change in therapy.    Hypertension:   His blood pressure is *** controlled.  No change in therapy.   Bradycardia:  ***  He  has had no presyncope or syncope.  He had normal chronotropic competence on his monitor in 2021.  I reviewed this with the patient and his wife.  He is not on any AV nodal blocking agents.  No change in therapy.    {Are you ordering a CV Procedure (e.g. stress test, cath, DCCV, TEE, etc)?   Press F2        :[id number]}  Follow up ***  Signed, Rollene Rotunda, MD

## 2023-08-27 ENCOUNTER — Ambulatory Visit: Payer: PPO | Attending: Cardiology | Admitting: Cardiology

## 2023-08-27 DIAGNOSIS — E785 Hyperlipidemia, unspecified: Secondary | ICD-10-CM

## 2023-08-27 DIAGNOSIS — I10 Essential (primary) hypertension: Secondary | ICD-10-CM

## 2023-08-27 DIAGNOSIS — R002 Palpitations: Secondary | ICD-10-CM

## 2023-08-27 DIAGNOSIS — R001 Bradycardia, unspecified: Secondary | ICD-10-CM

## 2023-08-28 ENCOUNTER — Encounter: Payer: Self-pay | Admitting: Cardiology

## 2023-09-09 ENCOUNTER — Encounter: Payer: Self-pay | Admitting: Nurse Practitioner

## 2023-09-09 ENCOUNTER — Ambulatory Visit: Payer: PPO | Attending: Cardiology | Admitting: Nurse Practitioner

## 2023-09-09 VITALS — BP 140/76 | HR 66 | Ht 70.0 in | Wt 194.2 lb

## 2023-09-09 DIAGNOSIS — R001 Bradycardia, unspecified: Secondary | ICD-10-CM

## 2023-09-09 DIAGNOSIS — T466X5A Adverse effect of antihyperlipidemic and antiarteriosclerotic drugs, initial encounter: Secondary | ICD-10-CM

## 2023-09-09 DIAGNOSIS — R002 Palpitations: Secondary | ICD-10-CM

## 2023-09-09 DIAGNOSIS — Q2112 Patent foramen ovale: Secondary | ICD-10-CM

## 2023-09-09 DIAGNOSIS — M791 Myalgia, unspecified site: Secondary | ICD-10-CM

## 2023-09-09 DIAGNOSIS — E785 Hyperlipidemia, unspecified: Secondary | ICD-10-CM | POA: Diagnosis not present

## 2023-09-09 DIAGNOSIS — R42 Dizziness and giddiness: Secondary | ICD-10-CM | POA: Diagnosis not present

## 2023-09-09 DIAGNOSIS — Z8673 Personal history of transient ischemic attack (TIA), and cerebral infarction without residual deficits: Secondary | ICD-10-CM | POA: Diagnosis not present

## 2023-09-09 DIAGNOSIS — I10 Essential (primary) hypertension: Secondary | ICD-10-CM | POA: Diagnosis not present

## 2023-09-09 DIAGNOSIS — I2510 Atherosclerotic heart disease of native coronary artery without angina pectoris: Secondary | ICD-10-CM | POA: Diagnosis not present

## 2023-09-09 NOTE — Progress Notes (Signed)
Office Visit    Patient Name: [name] [name] Date of Encounter: 09/09/2023  Primary Care Provider:  Benita Stabile, MD Primary Cardiologist:  Rollene Rotunda, MD  Chief Complaint    77 year old male with a history of nonobstructive CAD, palpitations, bradycardia, hypertension, hyperlipidemia, CVA, PFO, and BPH who presents for follow-up related to CAD.  Past Medical History    Past Medical History:  Diagnosis Date   BPH (benign prostatic hyperplasia)    CAD (coronary artery disease)    nonobstructive   CVA (cerebral vascular accident) (HCC) 2007   HTN (hypertension)    Patent foramen ovale    Past Surgical History:  Procedure Laterality Date   COLONOSCOPY  2003   hemorrhoids,otherwise normal.    COLONOSCOPY  09/2010   internal hemorrhoids, next TCS in 09/2015   COLONOSCOPY N/A 07/21/2019   Procedure: COLONOSCOPY;  Surgeon: Corbin Ade, MD;  Location: [facility];  Service: Endoscopy;  Laterality: N/A;  1:15pm-moved up to 12:00 per Mindy   ESOPHAGOGASTRODUODENOSCOPY  02/26/11   esophageal erosions consistent with mild relfux/small HH, antral erosions bx mild chronic gastritis and NO h.pylori   ESOPHAGOGASTRODUODENOSCOPY  09/2010   schatzki ring not manipulated. gastric ulcer with satellite erosions   GALLBLADDER SURGERY     INGUINAL HERNIA REPAIR  2006   left    Allergies  Allergies  Allergen Reactions   Asa [Aspirin] Other (See Comments)    Caused ulcer in stomach / GI bleeding      Labs/Other Studies Reviewed    The following studies were reviewed today:  Cardiac Studies & Procedures     STRESS TESTS  NM MYOCAR MULTI W/SPECT W 08/24/2020  Narrative  There was no ST segment deviation noted during stress.  The study is normal. There are no perfusion defects consistent with prior infarct or current ischemia.  This is a low risk study.  The left ventricular ejection fraction is normal (55-65%).   ECHOCARDIOGRAM  ECHOCARDIOGRAM COMPLETE BUBBLE STUDY  03/19/2018  Narrative *[facility]* [address] [place], [place] [address] [phone]  ------------------------------------------------------------------- Transthoracic Echocardiography  Patient:    [name] [name] MR #:       [id number] Study Date: 03/19/2018 Gender:     M Age:        72 Height:     177.8 cm Weight:     87 kg BSA:        2.09 m^2 Pt. Status: Room:  ATTENDING    Prentice Docker, MD ORDERING     Prentice Docker, MD REFERRING    Prentice Docker, MD PERFORMING   [facility], [facility]ani Hawking SONOGRAPHER  Celesta Gentile, RCS  cc:  ------------------------------------------------------------------- LV EF: 55%  ------------------------------------------------------------------- Indications:      Chest pain 786.51.  ------------------------------------------------------------------- History:   PMH:  Acquired from the patient and from the patient&'s chart.  PMH:  GERD.  Risk factors:  Hypertension. Dyslipidemia.  ------------------------------------------------------------------- Study Conclusions  - Left ventricle: The cavity size was normal. Wall thickness was increased in a pattern of mild LVH. The estimated ejection fraction was 55%. Wall motion was normal; there were no regional wall motion abnormalities. Doppler parameters are consistent with abnormal left ventricular relaxation (grade 1 diastolic dysfunction). - Aortic valve: Moderately calcified annulus. Trileaflet; mildly calcified leaflets. - Mitral valve: Mildly calcified annulus. There was trivial regurgitation. - Right atrium: Central venous pressure (est): 3 mm Hg. - Atrial septum: Agitated saline study shows visualized bubbles within the left atrium within 2-3  beats consistent with interatrial communication at level of secundum ASD or PFO. - Tricuspid valve: There was trivial regurgitation. - Pulmonary arteries: PA peak pressure: 21 mm Hg (S). - Pericardium, extracardiac:  There was no pericardial effusion.  ------------------------------------------------------------------- Study data:  Comparison was made to the study of 10/19/2015.  Study status:  Routine.  Procedure:  The patient reported no pain pre or post test. Transthoracic echocardiography. Image quality was adequate. Intravenous contrast (agitated saline) was administered. Study completion:  There were no complications. Transthoracic echocardiography.  M-mode, complete 2D, spectral Doppler, and color Doppler.  Birthdate:  Patient birthdate: 26-Nov-1946.  Age:  Patient is 77 yr old.  Sex:  Gender: male. BMI: 27.5 kg/m^2.  Blood pressure:     146/76  Patient status: Inpatient.  Study date:  Study date: 03/19/2018. Study time: 08:20 AM.  Location:  Echo laboratory.  -------------------------------------------------------------------  ------------------------------------------------------------------- Left ventricle:  The cavity size was normal. Wall thickness was increased in a pattern of mild LVH. The estimated ejection fraction was 55%. Wall motion was normal; there were no regional wall motion abnormalities. Doppler parameters are consistent with abnormal left ventricular relaxation (grade 1 diastolic dysfunction).  ------------------------------------------------------------------- Aortic valve:   Moderately calcified annulus. Trileaflet; mildly calcified leaflets.  Doppler:  There was no significant regurgitation.  ------------------------------------------------------------------- Aorta:  Aortic root: The aortic root was normal in size.  ------------------------------------------------------------------- Mitral valve:   Mildly calcified annulus.  Doppler:  There was trivial regurgitation.  ------------------------------------------------------------------- Left atrium:  The atrium was normal in size.  ------------------------------------------------------------------- Atrial septum:   Agitated saline study shows visualized bubbles within the left atrium within 2-3 beats consistent with interatrial communication at level of secundum ASD or PFO.  ------------------------------------------------------------------- Right ventricle:  The cavity size was normal. Systolic function was normal.  ------------------------------------------------------------------- Pulmonic valve:    The valve appears to be grossly normal. Doppler:  There was trivial regurgitation.  ------------------------------------------------------------------- Tricuspid valve:   The valve appears to be grossly normal. Doppler:  There was trivial regurgitation.  ------------------------------------------------------------------- Right atrium:  The atrium was normal in size.  ------------------------------------------------------------------- Pericardium:  There was no pericardial effusion.  ------------------------------------------------------------------- Systemic veins: Inferior vena cava: The vessel was normal in size. The respirophasic diameter changes were in the normal range (>= 50%), consistent with normal central venous pressure.  ------------------------------------------------------------------- Measurements  Left ventricle                             Value        Reference LV ID, ED, PLAX chordal                    49.5  mm     43 - 52 LV ID, ES, PLAX chordal                    32.7  mm     23 - 38 LV fx shortening, PLAX chordal             34    %      >=29 LV PW thickness, ED                        11.2  mm     ---------- IVS/LV PW ratio, ED                        1.05         <=  1.3 Stroke volume, 2D                          85    ml     ---------- Stroke volume/bsa, 2D                      41    ml/m^2 ---------- LV end-diastolic volume, 1-p A4C           87    ml     ---------- LV end-systolic volume, 1-p A4C            33    ml     ---------- LV ejection fraction, 1-p A4C               63    %      ---------- LV end-diastolic volume/bsa, 1-p           42    ml/m^2 ---------- A4C LV end-systolic volume/bsa, 1-p            16    ml/m^2 ---------- A4C LV end-diastolic volume, 2-p               77    ml     ---------- LV end-systolic volume, 2-p                30    ml     ---------- LV ejection fraction, 2-p                  61    %      ---------- Stroke volume, 2-p                         47    ml     ---------- LV end-diastolic volume/bsa, 2-p           37    ml/m^2 ---------- LV end-systolic volume/bsa, 2-p            14    ml/m^2 ---------- Stroke volume/bsa, 2-p                     22.5  ml/m^2 ---------- LV e&', lateral                             7.29  cm/s   ---------- LV E/e&', lateral                           7.63         ---------- LV e&', medial                              6.42  cm/s   ---------- LV E/e&', medial                            8.66         ---------- LV e&', average                             6.86  cm/s   ---------- LV E/e&', average  8.11         ----------  Ventricular septum                         Value        Reference IVS thickness, ED                          11.8  mm     ----------  LVOT                                       Value        Reference LVOT ID, S                                 22    mm     ---------- LVOT area                                  3.8   cm^2   ---------- LVOT peak velocity, S                      101   cm/s   ---------- LVOT mean velocity, S                      67.1  cm/s   ---------- LVOT VTI, S                                22.4  cm     ---------- LVOT peak gradient, S                      4     mm Hg  ----------  Aorta                                      Value        Reference Aortic root ID, ED                         33    mm     ----------  Left atrium                                Value        Reference LA ID, A-P, ES                             37    mm      ---------- LA ID/bsa, A-P                             1.77  cm/m^2 <=2.2 LA volume, ES, 1-p A4C                     53.6  ml     ---------- LA volume/bsa, ES,  1-p A4C                 25.7  ml/m^2 ---------- LA volume, ES, 1-p A2C                     49.1  ml     ---------- LA volume/bsa, ES, 1-p A2C                 23.5  ml/m^2 ----------  Mitral valve                               Value        Reference Mitral E-wave peak velocity                55.6  cm/s   ---------- Mitral A-wave peak velocity                93.2  cm/s   ---------- Mitral deceleration time           (H)     320   ms     150 - 230 Mitral E/A ratio, peak                     0.6          ----------  Pulmonary arteries                         Value        Reference PA pressure, S, DP                         21    mm Hg  <=30  Tricuspid valve                            Value        Reference Tricuspid regurg peak velocity             212   cm/s   ---------- Tricuspid peak RV-RA gradient              18    mm Hg  ----------  Right atrium                               Value        Reference RA ID, S-I, ES, A4C                (H)     53.1  mm     34 - 49 RA area, ES, A4C                           14    cm^2   8.3 - 19.5 RA volume, ES, A/L                         31    ml     ---------- RA volume/bsa, ES, A/L                     14.8  ml/m^2 ----------  Systemic veins  Value        Reference Estimated CVP                              3     mm Hg  ----------  Right ventricle                            Value        Reference RV ID, ED, PLAX                            30.2  mm     19 - 38 TAPSE                                      24    mm     ---------- RV pressure, S, DP                         21    mm Hg  <=30 RV s&', lateral, S                          15.2  cm/s   ----------  Legend: (L)  and  (H)  mark values outside specified reference  range.  ------------------------------------------------------------------- Prepared and Electronically Authenticated by  Nona Dell, M.D. 2019-04-03T09:58:49    MONITORS  CARDIAC EVENT MONITOR 09/06/2020  Narrative Normal sinus rhythm One run of supraventricular tachycardia 5 beats. No sustained arrhythmias.          Recent Labs: No results found for requested labs within last 365 days.  Recent Lipid Panel    Component Value Date/Time   CHOL 162 06/23/2009 2331   TRIG 113 06/23/2009 2331   HDL 47 06/23/2009 2331   CHOLHDL 3.4 Ratio 06/23/2009 2331   VLDL 23 06/23/2009 2331   LDLCALC 92 06/23/2009 2331    History of Present Illness    77 year old male with the above past medical history including nonobstructive CAD, palpitations, bradycardia, hypertension, hyperlipidemia, CVA, PFO, and BPH who presents for follow-up related to CAD.  He has a history of non obstructive CAD.  Coronary angiography in 2005 demonstrated a 50-60% stenosis in the proximal left circumflex coronary artery.  A nuclear stress test on 03/19/2018 demonstrated a large prior inferior/inferoseptal myocardial infarction with no evidence of ischemia. Echocardiogram in 2019 showed normal left ventricular systolic function and regional wall motion, LVEF 55%, mild LVH, grade 1 diastolic dysfunction without a wall motion abnormality.   He had evidence of a PFO or secundum ASD.  Pulmonary pressures were normal.  Event monitoring in April 2019 showed no significant arrhythmias with PVCs.  He has had an embolic CVA.   His last perfusion study was in September 2021 and was normal with no evidence of ischemia. He was last seen in the office on 05/07/2022 and was stable from a cardiac standpoint.  He denies symptoms concerning for angina.  Sleep study in 05/2022 revealed no significant sleep apnea.  He presents today for follow-up.  Since his last visit he has been stable from a cardiac standpoint.  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 His symptoms lasted for seconds and resolved spontaneously.  He denies any symptoms concerning for angina, denies any further dizziness, presyncope, syncope.  Overall, he reports feeling well.   Home Medications    Current Outpatient Medications  Medication Sig Dispense Refill   atorvastatin (LIPITOR) 20 MG tablet Take 1 tablet (20 mg total) by mouth daily. 30 tablet 0   clopidogrel (PLAVIX) 75 MG tablet Take 1 tablet by mouth once daily 90 tablet 3   desmopressin (DDAVP) 0.2 MG tablet Take 3 tabs po qhs 90 tablet 11   doxazosin (CARDURA) 8 MG tablet TAKE 1 TABLET BY MOUTH AT BEDTIME 90 tablet 3   finasteride (PROSCAR) 5 MG tablet Take 5 mg by mouth daily.       Melatonin 10 MG TABS Take 1 tablet by mouth at bedtime.     nitroGLYCERIN (NITROSTAT) 0.4 MG SL tablet Place 1 tablet (0.4 mg total) under the tongue every 5 (five) minutes as needed. 25 tablet 3   sildenafil (VIAGRA) 100 MG tablet Take 1 tablet (100 mg total) by mouth daily as needed for erectile dysfunction. 30 tablet prn   No current facility-administered medications for this visit.     Review of Systems    He denies chest pain, palpitations, dyspnea, pnd, orthopnea, n, v, syncope, edema, weight gain, or early satiety. All other systems reviewed and are otherwise negative except as noted above.   Physical Exam    VS:  BP (!) 140/76   Pulse 66   Ht 5\' 10"  (1.778 m)   Wt 194 lb 3.2 oz (88.1 kg)   SpO2 94%   BMI 27.86 kg/m  GEN: Well nourished, well developed, in no acute distress. HEENT: normal. Neck: Supple, no JVD, carotid bruits, or masses. Cardiac: RRR, no murmurs, rubs, or gallops. No clubbing, cyanosis, edema.  Radials/DP/PT 2+ and equal bilaterally.  Respiratory:  Respirations regular and unlabored, clear to auscultation bilaterally. GI: Soft, nontender, nondistended, BS +  x 4. MS: no deformity or atrophy. Skin: warm and dry, no rash. Neuro:  Strength and sensation are intact. Psych: Normal affect.  Accessory Clinical Findings    ECG personally reviewed by me today - EKG Interpretation Date/Time:  Monday September 09 2023 09:55:39 EDT Ventricular Rate:  66 PR Interval:  194 QRS Duration:  84 QT Interval:  372 QTC Calculation: 389 R Axis:   -25  Text Interpretation: Normal sinus rhythm Normal ECG Confirmed by Bernadene Person ([id number]) on 09/09/2023 10:04:35 AM  - no acute changes.   Lab Results  Component Value Date   WBC 2.8 (L) 07/16/2015   HGB 15.1 07/16/2015   HCT 43.3 07/16/2015   MCV 86.3 07/16/2015   PLT 85 (L) 07/16/2015   Lab Results  Component Value Date   CREATININE 1.34 (H) 07/16/2015   BUN 23 (H) 07/16/2015   NA 139 07/16/2015   K 3.7 07/16/2015   CL 109 07/16/2015   CO2 23 07/16/2015   Lab Results  Component Value Date   ALT 35 07/16/2015   AST 29 07/16/2015   ALKPHOS 48 07/16/2015   BILITOT 1.7 (H) 07/16/2015   Lab Results  Component Value Date   CHOL 162 06/23/2009   HDL 47 06/23/2009   LDLCALC 92 06/23/2009   TRIG 113 06/23/2009   CHOLHDL 3.4 Ratio 06/23/2009    No results found for: "HGBA1C"  Assessment & Plan   1. Nonobstructive CAD: Coronary angiography  in 2005 demonstrated a 50-60% stenosis in the proximal left circumflex coronary artery. His last perfusion study was in September 2021 was normal with no evidence of ischemia. Stable with no anginal symptoms. No indication for ischemic evaluation. Documented ASA allergy.  He takes Plavix for history of CVA.  Continue Lipitor.  2. Dizziness: He notes a recent episode of fleeting dizziness, he felt that his head was spinning.  He questions whether or not he had a TIA.  Prior carotid ultrasound in 2021 revealed no hemodynamically significant stenosis.  Will repeat carotid ultrasound.  Will update echo (patient does have history of CVA, PFO as below).  EKG stable in  office today, he denies any palpitations, presyncope, syncope.  Reviewed ED precautions.  Would also recommend follow-up with PCP.  3. History of palpitations/bradycardia: EKG today shows sinus rhythm. Denies any recent palpitations.  Not on any AV nodal blocking agents.  Stable.  4. Hypertension: BP slightly elevated in office today.  He notes that his BP has been intermittently elevated.  Given history of CVA, advanced age, would recommend goal SBP less than 140.  Continue to monitor.  If BP remains elevated above goal, consider additional antihypertensive therapy.  (Currently only takes Cardura for history of BPH).  5. Hyperlipidemia/statin myalgia: LDL was 68 in 08/2022.  He is pending repeat labs per PCP.  He did not tolerate high dose Lipitor due to arthralgias/myalgias.  Given history of CVA, would recommend trial of alternative high intensity statin therapy (could consider Crestor).  If he does not tolerate this, he would likely be a candidate for PCSK9 inhibitor.  For now, continue Lipitor.  6. History of CVA/PFO: No significant residual. Recent episode of dizziness as above. He has an incidental PFO.  Pending repeat echo, carotid ultrasound as above.  Continue Plavix, Lipitor.  7. Disposition: Follow-up in 6 months.   HYPERTENSION CONTROL Vitals:   09/09/23 0958 09/09/23 1027  BP: (!) 146/88 (!) 140/76    The patient's blood pressure is elevated above target today.  In order to address the patient's elevated BP: Blood pressure will be monitored at home to determine if medication changes need to be made.; Follow up with primary care provider for management.      Joylene Grapes, NP 09/09/2023, 10:37 AM

## 2023-09-09 NOTE — Patient Instructions (Signed)
Medication Instructions:  Your physician recommends that you continue on your current medications as directed. Please refer to the Current Medication list given to you today.  *If you need a refill on your cardiac medications before your next appointment, please call your pharmacy*   Lab Work: NONE ordered at this time of appointment     Testing/Procedures: Your physician has requested that you have a carotid duplex. This test is an ultrasound of the carotid arteries in your neck. It looks at blood flow through these arteries that supply the brain with blood. Allow one hour for this exam. There are no restrictions or special instructions.   Your physician has requested that you have an echocardiogram. Echocardiography is a painless test that uses sound waves to create images of your heart. It provides your doctor with information about the size and shape of your heart and how well your heart's chambers and valves are working. This procedure takes approximately one hour. There are no restrictions for this procedure. Please do NOT wear cologne, perfume, aftershave, or lotions (deodorant is allowed). Please arrive 15 minutes prior to your appointment time.    Follow-Up: At [facility], you and your health needs are our priority.  As part of our continuing mission to provide you with exceptional heart care, we have created designated Provider Care Teams.  These Care Teams include your primary Cardiologist (physician) and Advanced Practice Providers (APPs -  Physician Assistants and Nurse Practitioners) who all work together to provide you with the care you need, when you need it.  We recommend signing up for the patient portal called "MyChart".  Sign up information is provided on this After Visit Summary.  MyChart is used to connect with patients for Virtual Visits (Telemedicine).  Patients are able to view lab/test results, encounter notes, upcoming appointments, etc.  Non-urgent  messages can be sent to your provider as well.   To learn more about what you can do with MyChart, go to [email or web address].    Your next appointment:   6 month(s)  Provider:   Rollene Rotunda, MD

## 2023-09-10 ENCOUNTER — Telehealth: Payer: Self-pay | Admitting: Cardiology

## 2023-09-10 DIAGNOSIS — I2510 Atherosclerotic heart disease of native coronary artery without angina pectoris: Secondary | ICD-10-CM

## 2023-09-10 NOTE — Telephone Encounter (Signed)
*  STAT* If patient is at the pharmacy, call can be transferred to refill team.   1. Which medications need to be refilled? (please list name of each medication and dose if known) clopidogrel (PLAVIX) 75 MG tablet    2. Would you like to learn more about the convenience, safety, & potential cost savings by using the [facility]?      3. Are you open to using the [facility] (Type [facility].  ).   4. Which pharmacy/location (including street and city if local pharmacy) is medication to be sent to?  [id number] - [place], [place] - [address]    5. Do they need a 30 day or 90 day supply? 90   Patient is out of medication.

## 2023-09-11 NOTE — Telephone Encounter (Signed)
Pt's medication was sent to pt's pharmacy as requested. Confirmation received.  °

## 2023-09-13 ENCOUNTER — Ambulatory Visit (HOSPITAL_COMMUNITY)
Admission: RE | Admit: 2023-09-13 | Discharge: 2023-09-13 | Disposition: A | Discharge status: Home / Self Care | Payer: PPO | Source: Ambulatory Visit | Attending: Internal Medicine | Admitting: Internal Medicine

## 2023-09-13 DIAGNOSIS — R42 Dizziness and giddiness: Secondary | ICD-10-CM

## 2023-09-18 ENCOUNTER — Encounter: Payer: Self-pay | Admitting: *Deleted

## 2023-09-30 ENCOUNTER — Ambulatory Visit (HOSPITAL_COMMUNITY): Payer: PPO | Attending: Cardiology

## 2023-09-30 DIAGNOSIS — R42 Dizziness and giddiness: Secondary | ICD-10-CM

## 2023-09-30 DIAGNOSIS — Q2112 Patent foramen ovale: Secondary | ICD-10-CM | POA: Diagnosis not present

## 2023-09-30 LAB — ECHOCARDIOGRAM COMPLETE
Area-P 1/2: 2.62 cm2
Est EF: 55
S' Lateral: 3.1 cm

## 2023-10-03 ENCOUNTER — Encounter: Payer: Self-pay | Admitting: *Deleted

## 2023-10-04 ENCOUNTER — Other Ambulatory Visit: Payer: Self-pay | Admitting: Cardiology

## 2023-10-04 DIAGNOSIS — E785 Hyperlipidemia, unspecified: Secondary | ICD-10-CM

## 2024-02-19 NOTE — Progress Notes (Unsigned)
 Cardiology Office Note:   Date:  02/20/2024  ID:  [name] [name], DOB Oct 09, 1946, MRN [id number] PCP: Benita Stabile, MD  [facility] Providers Cardiologist:  Rollene Rotunda, MD {  History of Present Illness:   [name] [name] is a 78 y.o. male who presents for evaluation of CAD.   He has a history of non obstructive CAD.  Coronary angiography on 08/03/2004 demonstrated a 50-60% stenosis in the proximal left circumflex coronary artery. A nuclear stress test on 03/19/2018 demonstrated a large prior inferior/inferoseptal myocardial infarction with no evidence of ischemia. Echocardiogram on 03/19/18 showed normal left ventricular systolic function and regional wall motion, LVEF 55%, mild LVH, grade 1 diastolic dysfunction without a wall motion abnormality.   He had evidence of a PFO or secundum ASD.  Pulmonary pressures were normal.  Event monitoring in April 2019 showed no significant arrhythmias with PVCs.   He has had an embolic CVA.    His last perfusion study was in September 2021 and there are actually no reported defects including no suggestion of a prior infarct or current ischemia.    Has had no new symptoms.  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 The patient denies any new symptoms such as chest discomfort, neck or arm discomfort. There has been no new shortness of breath, PND or orthopnea. There have been no reported palpitations, presyncope or syncope.   He does have fatigue.  ROS: As stated in the HPI and negative for all other systems.  Studies Reviewed:    EKG:   EKG Interpretation Date/Time:  Thursday February 20 2024 10:45:06 EST Ventricular Rate:  58 PR Interval:  188 QRS Duration:  82 QT Interval:  408 QTC Calculation: 400 R Axis:   -27  Text Interpretation: Sinus bradycardia Cannot rule out Anterior infarct , age undetermined When compared with ECG of 09-Sep-2023 09:55, No significant change was found Confirmed by  Rollene Rotunda ([id number]) on 02/20/2024 11:15:57 AM   Risk Assessment/Calculations:              Physical Exam:   VS:  BP 138/68 (BP Location: Left Arm, Patient Position: Sitting, Cuff Size: Normal)   Pulse (!) 58   Ht 5\' 10"  (1.778 m)   Wt 182 lb 3.2 oz (82.6 kg)   SpO2 91%   BMI 26.14 kg/m    Wt Readings from Last 3 Encounters:  02/20/24 182 lb 3.2 oz (82.6 kg)  09/09/23 194 lb 3.2 oz (88.1 kg)  11/27/22 190 lb (86.2 kg)     GEN: Well nourished, well developed in no acute distress NECK: No JVD; No carotid bruits CARDIAC: RRR, no murmurs, rubs, gallops RESPIRATORY:  Clear to auscultation without rales, wheezing or rhonchi  ABDOMEN: Soft, non-tender, non-distended EXTREMITIES:  No edema; No deformity   ASSESSMENT AND PLAN:   Fatigue:    Given the STOP-BANG score of 5.  However, he did not have significant apnea on home sleep study.  No further workup or change in therapy.  CAD: He has had no new symptoms.  No change in therapy.   Hyperlipidemia:     I have asked him to check his lipid profile with his primary provider if has not been done.  I do not have access to that blood work.  He will send me the results.  History of PFO and CVA: He is had no symptoms related to this.  He is on Plavix.  No change in  therapy.  Hypertension:   His blood pressure is at target.  He will continue the meds as listed.  Carotid plaque: I will check a carotid Doppler in September 2026.  Follow up with me after the carotid next year.   Signed, Rollene Rotunda, MD

## 2024-02-20 ENCOUNTER — Ambulatory Visit: Payer: HMO | Attending: Cardiology | Admitting: Cardiology

## 2024-02-20 ENCOUNTER — Encounter: Payer: Self-pay | Admitting: Cardiology

## 2024-02-20 VITALS — BP 138/68 | HR 58 | Ht 70.0 in | Wt 182.2 lb

## 2024-02-20 DIAGNOSIS — I10 Essential (primary) hypertension: Secondary | ICD-10-CM

## 2024-02-20 DIAGNOSIS — R002 Palpitations: Secondary | ICD-10-CM

## 2024-02-20 DIAGNOSIS — I2510 Atherosclerotic heart disease of native coronary artery without angina pectoris: Secondary | ICD-10-CM

## 2024-02-20 DIAGNOSIS — E785 Hyperlipidemia, unspecified: Secondary | ICD-10-CM

## 2024-02-20 NOTE — Patient Instructions (Signed)
 Medication Instructions:  No changes. *If you need a refill on your cardiac medications before your next appointment, please call your pharmacy*  Testing/Procedures: You will need a follow up echocardiogram in September 2026. Please call this office to request that to be scheduled.    Follow-Up: At [facility], you and your health needs are our priority.  As part of our continuing mission to provide you with exceptional heart care, we have created designated Provider Care Teams.  These Care Teams include your primary Cardiologist (physician) and Advanced Practice Providers (APPs -  Physician Assistants and Nurse Practitioners) who all work together to provide you with the care you need, when you need it.  We recommend signing up for the patient portal called "MyChart".  Sign up information is provided on this After Visit Summary.  MyChart is used to connect with patients for Virtual Visits (Telemedicine).  Patients are able to view lab/test results, encounter notes, upcoming appointments, etc.  Non-urgent messages can be sent to your provider as well.   To learn more about what you can do with MyChart, go to [email or web address].    Your next appointment:   18  month(s)  Provider:   Rollene Rotunda, MD     Other Instructions

## 2024-03-20 ENCOUNTER — Encounter (HOSPITAL_COMMUNITY): Payer: Self-pay | Admitting: Internal Medicine

## 2024-03-20 ENCOUNTER — Other Ambulatory Visit (HOSPITAL_COMMUNITY): Payer: Self-pay | Admitting: Internal Medicine

## 2024-03-20 DIAGNOSIS — I10 Essential (primary) hypertension: Secondary | ICD-10-CM | POA: Diagnosis not present

## 2024-03-20 DIAGNOSIS — R252 Cramp and spasm: Secondary | ICD-10-CM

## 2024-03-20 DIAGNOSIS — Z713 Dietary counseling and surveillance: Secondary | ICD-10-CM | POA: Diagnosis not present

## 2024-03-20 DIAGNOSIS — N400 Enlarged prostate without lower urinary tract symptoms: Secondary | ICD-10-CM | POA: Diagnosis not present

## 2024-03-20 DIAGNOSIS — Z8673 Personal history of transient ischemic attack (TIA), and cerebral infarction without residual deficits: Secondary | ICD-10-CM | POA: Diagnosis not present

## 2024-03-20 DIAGNOSIS — Z79899 Other long term (current) drug therapy: Secondary | ICD-10-CM | POA: Diagnosis not present

## 2024-03-20 DIAGNOSIS — N401 Enlarged prostate with lower urinary tract symptoms: Secondary | ICD-10-CM | POA: Diagnosis not present

## 2024-03-20 DIAGNOSIS — Z7182 Exercise counseling: Secondary | ICD-10-CM | POA: Diagnosis not present

## 2024-03-20 DIAGNOSIS — I2510 Atherosclerotic heart disease of native coronary artery without angina pectoris: Secondary | ICD-10-CM | POA: Diagnosis not present

## 2024-03-27 ENCOUNTER — Ambulatory Visit (HOSPITAL_COMMUNITY)
Admission: RE | Admit: 2024-03-27 | Discharge: 2024-03-27 | Disposition: A | Discharge status: Home / Self Care | Source: Ambulatory Visit | Attending: Internal Medicine | Admitting: Internal Medicine

## 2024-03-27 DIAGNOSIS — R252 Cramp and spasm: Secondary | ICD-10-CM | POA: Diagnosis not present

## 2024-03-27 DIAGNOSIS — M79661 Pain in right lower leg: Secondary | ICD-10-CM | POA: Diagnosis not present

## 2024-03-27 DIAGNOSIS — M79662 Pain in left lower leg: Secondary | ICD-10-CM | POA: Diagnosis not present

## 2024-04-23 DIAGNOSIS — I10 Essential (primary) hypertension: Secondary | ICD-10-CM | POA: Diagnosis not present

## 2024-04-23 DIAGNOSIS — N401 Enlarged prostate with lower urinary tract symptoms: Secondary | ICD-10-CM | POA: Diagnosis not present

## 2024-04-23 DIAGNOSIS — R252 Cramp and spasm: Secondary | ICD-10-CM | POA: Diagnosis not present

## 2024-04-23 DIAGNOSIS — Z8673 Personal history of transient ischemic attack (TIA), and cerebral infarction without residual deficits: Secondary | ICD-10-CM | POA: Diagnosis not present

## 2024-04-29 DIAGNOSIS — Z23 Encounter for immunization: Secondary | ICD-10-CM | POA: Diagnosis not present

## 2024-04-29 DIAGNOSIS — I10 Essential (primary) hypertension: Secondary | ICD-10-CM | POA: Diagnosis not present

## 2024-04-29 DIAGNOSIS — N1831 Chronic kidney disease, stage 3a: Secondary | ICD-10-CM | POA: Diagnosis not present

## 2024-04-29 DIAGNOSIS — N401 Enlarged prostate with lower urinary tract symptoms: Secondary | ICD-10-CM | POA: Diagnosis not present

## 2024-04-29 DIAGNOSIS — I129 Hypertensive chronic kidney disease with stage 1 through stage 4 chronic kidney disease, or unspecified chronic kidney disease: Secondary | ICD-10-CM | POA: Diagnosis not present

## 2024-04-29 DIAGNOSIS — R7301 Impaired fasting glucose: Secondary | ICD-10-CM | POA: Diagnosis not present

## 2024-04-29 DIAGNOSIS — I2510 Atherosclerotic heart disease of native coronary artery without angina pectoris: Secondary | ICD-10-CM | POA: Diagnosis not present

## 2024-04-29 DIAGNOSIS — Z0001 Encounter for general adult medical examination with abnormal findings: Secondary | ICD-10-CM | POA: Diagnosis not present

## 2024-04-29 DIAGNOSIS — M542 Cervicalgia: Secondary | ICD-10-CM | POA: Diagnosis not present

## 2024-04-29 DIAGNOSIS — Z8673 Personal history of transient ischemic attack (TIA), and cerebral infarction without residual deficits: Secondary | ICD-10-CM | POA: Diagnosis not present

## 2024-04-29 DIAGNOSIS — E782 Mixed hyperlipidemia: Secondary | ICD-10-CM | POA: Diagnosis not present

## 2024-04-29 DIAGNOSIS — R252 Cramp and spasm: Secondary | ICD-10-CM | POA: Diagnosis not present

## 2024-05-20 DIAGNOSIS — N1831 Chronic kidney disease, stage 3a: Secondary | ICD-10-CM | POA: Diagnosis not present

## 2024-06-10 ENCOUNTER — Other Ambulatory Visit: Payer: Self-pay | Admitting: Urology

## 2024-06-10 DIAGNOSIS — N401 Enlarged prostate with lower urinary tract symptoms: Secondary | ICD-10-CM

## 2024-08-08 DIAGNOSIS — N1831 Chronic kidney disease, stage 3a: Secondary | ICD-10-CM | POA: Diagnosis not present

## 2024-08-08 DIAGNOSIS — E7849 Other hyperlipidemia: Secondary | ICD-10-CM | POA: Diagnosis not present

## 2024-08-08 DIAGNOSIS — I2510 Atherosclerotic heart disease of native coronary artery without angina pectoris: Secondary | ICD-10-CM | POA: Diagnosis not present

## 2024-08-08 DIAGNOSIS — I5032 Chronic diastolic (congestive) heart failure: Secondary | ICD-10-CM | POA: Diagnosis not present

## 2024-08-10 ENCOUNTER — Other Ambulatory Visit (HOSPITAL_COMMUNITY): Payer: Self-pay | Admitting: Nephrology

## 2024-08-10 DIAGNOSIS — I129 Hypertensive chronic kidney disease with stage 1 through stage 4 chronic kidney disease, or unspecified chronic kidney disease: Secondary | ICD-10-CM

## 2024-08-10 DIAGNOSIS — N1831 Chronic kidney disease, stage 3a: Secondary | ICD-10-CM

## 2024-08-11 DIAGNOSIS — Z6826 Body mass index (BMI) 26.0-26.9, adult: Secondary | ICD-10-CM | POA: Diagnosis not present

## 2024-08-11 DIAGNOSIS — Z7182 Exercise counseling: Secondary | ICD-10-CM | POA: Diagnosis not present

## 2024-08-11 DIAGNOSIS — N401 Enlarged prostate with lower urinary tract symptoms: Secondary | ICD-10-CM | POA: Diagnosis not present

## 2024-08-11 DIAGNOSIS — Z713 Dietary counseling and surveillance: Secondary | ICD-10-CM | POA: Diagnosis not present

## 2024-08-11 DIAGNOSIS — Z79899 Other long term (current) drug therapy: Secondary | ICD-10-CM | POA: Diagnosis not present

## 2024-08-11 DIAGNOSIS — I129 Hypertensive chronic kidney disease with stage 1 through stage 4 chronic kidney disease, or unspecified chronic kidney disease: Secondary | ICD-10-CM | POA: Diagnosis not present

## 2024-08-11 DIAGNOSIS — I10 Essential (primary) hypertension: Secondary | ICD-10-CM | POA: Diagnosis not present

## 2024-08-11 DIAGNOSIS — N1831 Chronic kidney disease, stage 3a: Secondary | ICD-10-CM | POA: Diagnosis not present

## 2024-08-26 ENCOUNTER — Other Ambulatory Visit: Payer: Self-pay | Admitting: Cardiology

## 2024-08-26 DIAGNOSIS — I2510 Atherosclerotic heart disease of native coronary artery without angina pectoris: Secondary | ICD-10-CM

## 2024-09-07 ENCOUNTER — Other Ambulatory Visit: Payer: Self-pay | Admitting: Cardiology

## 2024-09-07 DIAGNOSIS — E785 Hyperlipidemia, unspecified: Secondary | ICD-10-CM

## 2024-09-13 NOTE — Progress Notes (Unsigned)
  Cardiology Office Note:   Date:  09/14/2024  ID:  [name] [name], DOB 1946/07/15, MRN [id number] PCP: Shona Norleen PEDLAR, MD  [facility] Providers Cardiologist:  Lynwood Schilling, MD {  History of Present Illness:   [name] [name] is a 78 y.o. male for evaluation of CAD.   He has a history of non obstructive CAD.  Coronary angiography on 08/03/2004 demonstrated a 50-60% stenosis in the proximal left circumflex coronary artery. A nuclear stress test on 03/19/2018 demonstrated a large prior inferior/inferoseptal myocardial infarction with no evidence of ischemia. Echocardiogram on 03/19/18 showed normal left ventricular systolic function and regional wall motion, LVEF 55%, mild LVH, grade 1 diastolic dysfunction without a wall motion abnormality.   He had evidence of a PFO or secundum ASD.  Pulmonary pressures were normal.  Event monitoring in April 2019 showed no significant arrhythmias with PVCs.   He has had an embolic CVA.    His last perfusion study was in September 2021 and there are actually no reported defects including no suggestion of a prior infarct or current ischemia.  Echo in 2024 demonstrated an EF of 55%.   There were no significant valvular abnormalities.   There was no mention of PFO.       Since I last saw him he has done well.  The patient denies any new symptoms such as chest discomfort, neck or arm discomfort. There has been no new shortness of breath, PND or orthopnea. There have been no reported palpitations, presyncope or syncope.   He is helping his son build a house.  He is retired from Technical brewer at [employer].  He just celebrated his 60th wedding anniversary.  ROS: As stated in the HPI and negative for all other systems.  Studies Reviewed:    EKG:     NA  Risk Assessment/Calculations:              Physical Exam:   VS:  BP 114/66   Pulse 64   Ht 5' 10 (1.778 m)   Wt 185 lb (83.9 kg)   SpO2 94%   BMI 26.54 kg/m    Wt Readings from Last 3 Encounters:  09/14/24  185 lb (83.9 kg)  02/20/24 182 lb 3.2 oz (82.6 kg)  09/09/23 194 lb 3.2 oz (88.1 kg)     GEN: Well nourished, well developed in no acute distress NECK: No JVD; No carotid bruits CARDIAC: RRR, no murmurs, rubs, gallops RESPIRATORY:  Clear to auscultation without rales, wheezing or rhonchi  ABDOMEN: Soft, non-tender, non-distended EXTREMITIES:  No edema; No deformity   ASSESSMENT AND PLAN:   Fatigue:   He did not have significant sleep apnea on a sleep study previously.  He says his fatigue is manageable.  No change in therapy.   CAD:   The patient has no new sypmtoms.  No further cardiovascular testing is indicated.  We will continue with aggressive risk reduction and meds as listed.  Hyperlipidemia:    LDL was 71 with an HDL of 41.  No change in therapy.   History of PFO and CVA:   The PFO was not mentioned in the most recent echo.  No change in therapy.  Hypertension:   He is at target.  No change in therapy.   Carotid plaque:   He will have carotids on September 2026.  He had mild  to moderate plaque previously.    Follow up with me in 1 year.  Signed, Lynwood Schilling, MD

## 2024-09-14 ENCOUNTER — Encounter: Payer: Self-pay | Admitting: Cardiology

## 2024-09-14 ENCOUNTER — Ambulatory Visit: Attending: Cardiology | Admitting: Cardiology

## 2024-09-14 VITALS — BP 114/66 | HR 64 | Ht 70.0 in | Wt 185.0 lb

## 2024-09-14 DIAGNOSIS — I2510 Atherosclerotic heart disease of native coronary artery without angina pectoris: Secondary | ICD-10-CM

## 2024-09-14 DIAGNOSIS — I10 Essential (primary) hypertension: Secondary | ICD-10-CM | POA: Diagnosis not present

## 2024-09-14 DIAGNOSIS — E785 Hyperlipidemia, unspecified: Secondary | ICD-10-CM

## 2024-09-14 DIAGNOSIS — R5383 Other fatigue: Secondary | ICD-10-CM

## 2024-09-14 NOTE — Patient Instructions (Addendum)
 Medication Instructions:  Your physician recommends that you continue on your current medications as directed. Please refer to the Current Medication list given to you today.  *If you need a refill on your cardiac medications before your next appointment, please call your pharmacy*  Lab Work: NONE If you have labs (blood work) drawn today and your tests are completely normal, you will receive your results only by: MyChart Message (if you have MyChart) OR A paper copy in the mail If you have any lab test that is abnormal or we need to change your treatment, we will call you to review the results.  Testing/Procedures: Carotid Doppler Sept 2026  Follow-Up: At [facility], you and your health needs are our priority.  As part of our continuing mission to provide you with exceptional heart care, our providers are all part of one team.  This team includes your primary Cardiologist (physician) and Advanced Practice Providers or APPs (Physician Assistants and Nurse Practitioners) who all work together to provide you with the care you need, when you need it.  Your next appointment:   1 year(s)  Provider:   Lynwood Schilling, MD    We recommend signing up for the patient portal called MyChart.  Sign up information is provided on this After Visit Summary.  MyChart is used to connect with patients for Virtual Visits (Telemedicine).  Patients are able to view lab/test results, encounter notes, upcoming appointments, etc.  Non-urgent messages can be sent to your provider as well.   To learn more about what you can do with MyChart, go to [email or web address].

## 2024-10-21 DIAGNOSIS — Z8673 Personal history of transient ischemic attack (TIA), and cerebral infarction without residual deficits: Secondary | ICD-10-CM | POA: Diagnosis not present

## 2024-10-21 DIAGNOSIS — Z23 Encounter for immunization: Secondary | ICD-10-CM | POA: Diagnosis not present

## 2024-10-22 LAB — LAB REPORT - SCANNED
A1c: 5.7
Albumin, Urine POC: 15.6
Creatinine, POC: 126.9 mg/dL
EGFR: 49
Microalb Creat Ratio: 12

## 2024-10-27 DIAGNOSIS — M542 Cervicalgia: Secondary | ICD-10-CM | POA: Diagnosis not present

## 2024-10-27 DIAGNOSIS — I10 Essential (primary) hypertension: Secondary | ICD-10-CM | POA: Diagnosis not present

## 2024-10-27 DIAGNOSIS — I129 Hypertensive chronic kidney disease with stage 1 through stage 4 chronic kidney disease, or unspecified chronic kidney disease: Secondary | ICD-10-CM | POA: Diagnosis not present

## 2024-10-27 DIAGNOSIS — E782 Mixed hyperlipidemia: Secondary | ICD-10-CM | POA: Diagnosis not present

## 2024-10-27 DIAGNOSIS — N1831 Chronic kidney disease, stage 3a: Secondary | ICD-10-CM | POA: Diagnosis not present

## 2024-10-27 DIAGNOSIS — Z0000 Encounter for general adult medical examination without abnormal findings: Secondary | ICD-10-CM | POA: Diagnosis not present

## 2024-10-27 DIAGNOSIS — E870 Hyperosmolality and hypernatremia: Secondary | ICD-10-CM | POA: Diagnosis not present

## 2024-10-27 DIAGNOSIS — N401 Enlarged prostate with lower urinary tract symptoms: Secondary | ICD-10-CM | POA: Diagnosis not present

## 2024-10-27 DIAGNOSIS — I2510 Atherosclerotic heart disease of native coronary artery without angina pectoris: Secondary | ICD-10-CM | POA: Diagnosis not present

## 2024-10-27 DIAGNOSIS — R252 Cramp and spasm: Secondary | ICD-10-CM | POA: Diagnosis not present

## 2024-10-27 DIAGNOSIS — R7301 Impaired fasting glucose: Secondary | ICD-10-CM | POA: Diagnosis not present

## 2024-10-27 DIAGNOSIS — R42 Dizziness and giddiness: Secondary | ICD-10-CM | POA: Diagnosis not present

## 2024-10-28 DIAGNOSIS — N1831 Chronic kidney disease, stage 3a: Secondary | ICD-10-CM | POA: Diagnosis not present

## 2024-10-28 DIAGNOSIS — I129 Hypertensive chronic kidney disease with stage 1 through stage 4 chronic kidney disease, or unspecified chronic kidney disease: Secondary | ICD-10-CM | POA: Diagnosis not present

## 2024-10-28 DIAGNOSIS — R7303 Prediabetes: Secondary | ICD-10-CM | POA: Diagnosis not present

## 2024-10-28 DIAGNOSIS — I5032 Chronic diastolic (congestive) heart failure: Secondary | ICD-10-CM | POA: Diagnosis not present

## 2024-11-02 ENCOUNTER — Ambulatory Visit: Admitting: Internal Medicine

## 2024-11-02 ENCOUNTER — Encounter: Payer: Self-pay | Admitting: Internal Medicine

## 2024-11-02 VITALS — BP 120/69 | HR 60 | Temp 97.8°F | Ht 70.0 in | Wt 182.6 lb

## 2024-11-02 DIAGNOSIS — Z800 Family history of malignant neoplasm of digestive organs: Secondary | ICD-10-CM | POA: Diagnosis not present

## 2024-11-02 DIAGNOSIS — R194 Change in bowel habit: Secondary | ICD-10-CM | POA: Diagnosis not present

## 2024-11-02 NOTE — Patient Instructions (Signed)
 I was very nice to see you again today!  As discussed, I feel best practice would be to go ahead and perform a colonoscopy for change in bowel habits/stool caliber ASA 3 (room 1 okay)  You may stay on your Plavix  on or interrupted for the procedure.  Further recommendations to follow.

## 2024-11-02 NOTE — Progress Notes (Unsigned)
 Gastroenterology Progress Note    Primary Care Physician:  Shona Norleen PEDLAR, MD Primary Gastroenterologist:  Dr. Shaaron  Pre-Procedure History & Physical: HPI:  [name] [name] is a 78 y.o. male here for   For further evaluation of intermittent episodes of diarrhea and pencil thin stool.  No associated bleeding-symptoms ongoing for 3 to 4 months.  No recent travel no new medications no recent antibiotics.  It is notable he has normal bowel function for several days in between episodes of symptoms.  Back in 2023 we saw him for constipation.  He was prescribed MiraLAX symptoms normalized subsequent to that time until last few months.  Has not had any abdominal pain nausea or vomiting or early satiety.  No significant reflux these days.  Family history is significant and that his brother was diagnosed with colon cancer in his early 70s.   Patient's colon was entirely normal in 2020.   This gentleman remains very active at age 12.    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   History of TIA but not CVA.  He remains on Plavix .  Patent foramen ovale.  Past Medical History:  Diagnosis Date   BPH (benign prostatic hyperplasia)    CAD (coronary artery disease)    nonobstructive   CVA (cerebral vascular accident) (HCC) 2007   HTN (hypertension)    Patent foramen ovale     Past Surgical History:  Procedure Laterality Date   COLONOSCOPY  2003   hemorrhoids,otherwise normal.    COLONOSCOPY  09/2010   internal hemorrhoids, next TCS in 09/2015   COLONOSCOPY N/A 07/21/2019   Procedure: COLONOSCOPY;  Surgeon: Shaaron Lamar HERO, MD;  Location: [facility];  Service: Endoscopy;  Laterality: N/A;  1:15pm-moved up to 12:00 per Mindy   ESOPHAGOGASTRODUODENOSCOPY  02/26/11   esophageal erosions consistent with mild relfux/small HH, antral erosions bx mild chronic gastritis and NO h.pylori   ESOPHAGOGASTRODUODENOSCOPY  09/2010   schatzki ring not manipulated. gastric ulcer with satellite  erosions   GALLBLADDER SURGERY     INGUINAL HERNIA REPAIR  2006   left    Prior to Admission medications   Medication Sig Start Date End Date Taking? Authorizing Provider  atorvastatin  (LIPITOR) 20 MG tablet Take 1 tablet by mouth once daily 09/08/24  Yes Hochrein, Lynwood, MD  clopidogrel  (PLAVIX ) 75 MG tablet Take 1 tablet by mouth once daily 08/26/24  Yes Hochrein, Lynwood, MD  doxazosin  (CARDURA ) 8 MG tablet TAKE 1 TABLET BY MOUTH AT BEDTIME 06/12/24  Yes Dahlstedt, Garnette, MD  finasteride  (PROSCAR ) 5 MG tablet Take 5 mg by mouth daily.     Yes [provider]  Melatonin 10 MG TABS Take 1 tablet by mouth at bedtime.   Yes [provider]  nitroGLYCERIN  (NITROSTAT ) 0.4 MG SL tablet Place 1 tablet (0.4 mg total) under the tongue every 5 (five) minutes as needed. 03/31/18  Yes Charls Pearla LABOR, MD  sildenafil  (VIAGRA ) 100 MG tablet Take 1 tablet (100 mg total) by mouth daily as needed for erectile dysfunction. 11/27/22  Yes Matilda Garnette, MD    Allergies as of 11/02/2024 - Review Complete 11/02/2024  Allergen Reaction Noted   Asa [aspirin] Other (See Comments) 10/08/2014    Family History  Problem Relation Age of Onset   Other Brother        patient had colostomy at young age with long history of colon issues. previously reported possible colon cancer in his 6s but  patient not sure about this   Colon polyps Mother        advanced age, died secondary to AAA rupture   COPD Father     Social History   Socioeconomic History   Marital status: Married    Spouse name: Not on file   Number of children: Not on file   Years of education: Not on file   Highest education level: Not on file  Occupational History   Occupation: retired from [employer]  Tobacco Use   Smoking status: Never   Smokeless tobacco: Never  Vaping Use   Vaping status: Never Used  Substance and Sexual Activity   Alcohol use: No   Drug use: No   Sexual activity: Not Currently     Partners: Female    Comment: MARRIED  Other Topics Concern   Not on file  Social History Narrative   Not on file   Social Drivers of Health   Financial Resource Strain: Not on file  Food Insecurity: Not on file  Transportation Needs: Not on file  Physical Activity: Not on file  Stress: Not on file  Social Connections: Not on file  Intimate Partner Violence: Not on file    Review of Systems   See HPI, otherwise negative ROS  Physical Exam: BP 120/69   Pulse 60   Temp 97.8 F (36.6 C) (Oral)   Ht 5' 10 (1.778 m)   Wt 182 lb 9.6 oz (82.8 kg)   SpO2 94%   BMI 26.20 kg/m  General:   Alert,  Well-developed, well-nourished, pleasant and cooperative in NAD Neck:  Supple; no masses or thyromegaly. No significant cervical adenopathy. Lungs:  Clear throughout to auscultation.   No wheezes, crackles, or rhonchi. No acute distress. Heart:  Regular rate and rhythm; no murmurs, clicks, rubs,  or gallops. Abdomen: Non-distended, normal bowel sounds.  Soft and nontender without appreciable mass or hepatosplenomegaly.    Impression/Plan:     78 year old gentleman with a positive family history colon cancer in a first-degree relative at a young age who presents with change in bowel habits/change in stool caliber.  Symptoms are quite intermittent.  He symptoms are punctuated by normal bowel function.  Therefore this is likely not an infectious process or medication effect.  Moreover, likely not symptoms emanating from any potential progressive luminal obstruction.  However, he does have new symptoms and a positive family history colon cancer which bears further evaluation.    Recommendations:  I have offered the patient a diagnostic colonoscopy in the near future ASA 3 (room 1 okay).The risks, benefits, limitations, alternatives and imponderables have been reviewed with the patient. Questions have been answered. All parties are agreeable.      Notice: This dictation was prepared with  Dragon dictation along with smaller phrase technology. Any transcriptional errors that result from this process are unintentional and may not be corrected upon review.

## 2024-11-02 NOTE — H&P (View-Only) (Signed)
 Gastroenterology Progress Note    Primary Care Physician:  Shona Norleen PEDLAR, MD Primary Gastroenterologist:  Dr. Shaaron  Pre-Procedure History & Physical: HPI:  [name] [name] is a 78 y.o. male here for   For further evaluation of intermittent episodes of diarrhea and pencil thin stool.  No associated bleeding-symptoms ongoing for 3 to 4 months.  No recent travel no new medications no recent antibiotics.  It is notable he has normal bowel function for several days in between episodes of symptoms.  Back in 2023 we saw him for constipation.  He was prescribed MiraLAX symptoms normalized subsequent to that time until last few months.  Has not had any abdominal pain nausea or vomiting or early satiety.  No significant reflux these days.  Family history is significant and that his brother was diagnosed with colon cancer in his early 70s.   Patient's colon was entirely normal in 2020.   This gentleman remains very active at age 12.    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   History of TIA but not CVA.  He remains on Plavix .  Patent foramen ovale.  Past Medical History:  Diagnosis Date   BPH (benign prostatic hyperplasia)    CAD (coronary artery disease)    nonobstructive   CVA (cerebral vascular accident) (HCC) 2007   HTN (hypertension)    Patent foramen ovale     Past Surgical History:  Procedure Laterality Date   COLONOSCOPY  2003   hemorrhoids,otherwise normal.    COLONOSCOPY  09/2010   internal hemorrhoids, next TCS in 09/2015   COLONOSCOPY N/A 07/21/2019   Procedure: COLONOSCOPY;  Surgeon: Shaaron Lamar HERO, MD;  Location: [facility];  Service: Endoscopy;  Laterality: N/A;  1:15pm-moved up to 12:00 per Mindy   ESOPHAGOGASTRODUODENOSCOPY  02/26/11   esophageal erosions consistent with mild relfux/small HH, antral erosions bx mild chronic gastritis and NO h.pylori   ESOPHAGOGASTRODUODENOSCOPY  09/2010   schatzki ring not manipulated. gastric ulcer with satellite  erosions   GALLBLADDER SURGERY     INGUINAL HERNIA REPAIR  2006   left    Prior to Admission medications   Medication Sig Start Date End Date Taking? Authorizing Provider  atorvastatin  (LIPITOR) 20 MG tablet Take 1 tablet by mouth once daily 09/08/24  Yes Hochrein, Lynwood, MD  clopidogrel  (PLAVIX ) 75 MG tablet Take 1 tablet by mouth once daily 08/26/24  Yes Hochrein, Lynwood, MD  doxazosin  (CARDURA ) 8 MG tablet TAKE 1 TABLET BY MOUTH AT BEDTIME 06/12/24  Yes Dahlstedt, Garnette, MD  finasteride  (PROSCAR ) 5 MG tablet Take 5 mg by mouth daily.     Yes [provider]  Melatonin 10 MG TABS Take 1 tablet by mouth at bedtime.   Yes [provider]  nitroGLYCERIN  (NITROSTAT ) 0.4 MG SL tablet Place 1 tablet (0.4 mg total) under the tongue every 5 (five) minutes as needed. 03/31/18  Yes Charls Pearla LABOR, MD  sildenafil  (VIAGRA ) 100 MG tablet Take 1 tablet (100 mg total) by mouth daily as needed for erectile dysfunction. 11/27/22  Yes Matilda Garnette, MD    Allergies as of 11/02/2024 - Review Complete 11/02/2024  Allergen Reaction Noted   Asa [aspirin] Other (See Comments) 10/08/2014    Family History  Problem Relation Age of Onset   Other Brother        patient had colostomy at young age with long history of colon issues. previously reported possible colon cancer in his 6s but  patient not sure about this   Colon polyps Mother        advanced age, died secondary to AAA rupture   COPD Father     Social History   Socioeconomic History   Marital status: Married    Spouse name: Not on file   Number of children: Not on file   Years of education: Not on file   Highest education level: Not on file  Occupational History   Occupation: retired from [employer]  Tobacco Use   Smoking status: Never   Smokeless tobacco: Never  Vaping Use   Vaping status: Never Used  Substance and Sexual Activity   Alcohol use: No   Drug use: No   Sexual activity: Not Currently     Partners: Female    Comment: MARRIED  Other Topics Concern   Not on file  Social History Narrative   Not on file   Social Drivers of Health   Financial Resource Strain: Not on file  Food Insecurity: Not on file  Transportation Needs: Not on file  Physical Activity: Not on file  Stress: Not on file  Social Connections: Not on file  Intimate Partner Violence: Not on file    Review of Systems   See HPI, otherwise negative ROS  Physical Exam: BP 120/69   Pulse 60   Temp 97.8 F (36.6 C) (Oral)   Ht 5' 10 (1.778 m)   Wt 182 lb 9.6 oz (82.8 kg)   SpO2 94%   BMI 26.20 kg/m  General:   Alert,  Well-developed, well-nourished, pleasant and cooperative in NAD Neck:  Supple; no masses or thyromegaly. No significant cervical adenopathy. Lungs:  Clear throughout to auscultation.   No wheezes, crackles, or rhonchi. No acute distress. Heart:  Regular rate and rhythm; no murmurs, clicks, rubs,  or gallops. Abdomen: Non-distended, normal bowel sounds.  Soft and nontender without appreciable mass or hepatosplenomegaly.    Impression/Plan:     78 year old gentleman with a positive family history colon cancer in a first-degree relative at a young age who presents with change in bowel habits/change in stool caliber.  Symptoms are quite intermittent.  He symptoms are punctuated by normal bowel function.  Therefore this is likely not an infectious process or medication effect.  Moreover, likely not symptoms emanating from any potential progressive luminal obstruction.  However, he does have new symptoms and a positive family history colon cancer which bears further evaluation.    Recommendations:  I have offered the patient a diagnostic colonoscopy in the near future ASA 3 (room 1 okay).The risks, benefits, limitations, alternatives and imponderables have been reviewed with the patient. Questions have been answered. All parties are agreeable.      Notice: This dictation was prepared with  Dragon dictation along with smaller phrase technology. Any transcriptional errors that result from this process are unintentional and may not be corrected upon review.

## 2024-11-03 ENCOUNTER — Other Ambulatory Visit: Payer: Self-pay | Admitting: *Deleted

## 2024-11-03 ENCOUNTER — Encounter: Payer: Self-pay | Admitting: *Deleted

## 2024-11-03 MED ORDER — PEG 3350-KCL-NA BICARB-NACL 420 G PO SOLR: 4000.0000 mL | Freq: Once | ORAL | 0 refills | Status: AC

## 2024-11-26 ENCOUNTER — Ambulatory Visit (HOSPITAL_COMMUNITY): Admitting: Anesthesiology

## 2024-11-26 ENCOUNTER — Other Ambulatory Visit: Payer: Self-pay

## 2024-11-26 ENCOUNTER — Encounter (HOSPITAL_COMMUNITY): Payer: Self-pay | Admitting: Internal Medicine

## 2024-11-26 ENCOUNTER — Ambulatory Visit (HOSPITAL_COMMUNITY)
Admission: RE | Admit: 2024-11-26 | Discharge: 2024-11-26 | Disposition: A | Attending: Internal Medicine | Admitting: Internal Medicine

## 2024-11-26 ENCOUNTER — Encounter (HOSPITAL_COMMUNITY)
Admission: RE | Disposition: A | Discharge status: Home / Self Care | Payer: Self-pay | Source: Home / Self Care | Attending: Internal Medicine

## 2024-11-26 DIAGNOSIS — I2510 Atherosclerotic heart disease of native coronary artery without angina pectoris: Secondary | ICD-10-CM | POA: Diagnosis not present

## 2024-11-26 DIAGNOSIS — Z800 Family history of malignant neoplasm of digestive organs: Secondary | ICD-10-CM | POA: Diagnosis not present

## 2024-11-26 DIAGNOSIS — K635 Polyp of colon: Secondary | ICD-10-CM

## 2024-11-26 DIAGNOSIS — R194 Change in bowel habit: Secondary | ICD-10-CM | POA: Diagnosis present

## 2024-11-26 DIAGNOSIS — Q2112 Patent foramen ovale: Secondary | ICD-10-CM | POA: Diagnosis not present

## 2024-11-26 DIAGNOSIS — Z7902 Long term (current) use of antithrombotics/antiplatelets: Secondary | ICD-10-CM | POA: Diagnosis not present

## 2024-11-26 DIAGNOSIS — D127 Benign neoplasm of rectosigmoid junction: Secondary | ICD-10-CM | POA: Diagnosis not present

## 2024-11-26 DIAGNOSIS — I10 Essential (primary) hypertension: Secondary | ICD-10-CM | POA: Diagnosis not present

## 2024-11-26 DIAGNOSIS — R195 Other fecal abnormalities: Secondary | ICD-10-CM | POA: Diagnosis not present

## 2024-11-26 DIAGNOSIS — K219 Gastro-esophageal reflux disease without esophagitis: Secondary | ICD-10-CM | POA: Diagnosis not present

## 2024-11-26 DIAGNOSIS — K5900 Constipation, unspecified: Secondary | ICD-10-CM | POA: Diagnosis not present

## 2024-11-26 DIAGNOSIS — Z8673 Personal history of transient ischemic attack (TIA), and cerebral infarction without residual deficits: Secondary | ICD-10-CM | POA: Diagnosis not present

## 2024-11-26 HISTORY — PX: POLYPECTOMY: SHX149

## 2024-11-26 HISTORY — PX: COLONOSCOPY: SHX5424

## 2024-11-26 SURGERY — COLONOSCOPY
Anesthesia: Monitor Anesthesia Care

## 2024-11-26 MED ORDER — PROPOFOL 500 MG/50ML IV EMUL
INTRAVENOUS | Status: DC | PRN
  Administered 2024-11-26: 150 ug/kg/min via INTRAVENOUS

## 2024-11-26 MED ORDER — LACTATED RINGERS IV SOLN: INTRAVENOUS | Status: DC

## 2024-11-26 MED ORDER — PROPOFOL 10 MG/ML IV BOLUS
INTRAVENOUS | Status: DC | PRN
  Administered 2024-11-26: 100 mg via INTRAVENOUS

## 2024-11-26 MED ORDER — LIDOCAINE 2% (20 MG/ML) 5 ML SYRINGE
INTRAMUSCULAR | Status: DC | PRN
  Administered 2024-11-26: 80 mg via INTRAVENOUS

## 2024-11-26 NOTE — Interval H&P Note (Signed)
 History and Physical Interval Note:  11/26/2024 7:25 AM  [name] [name]  has presented today for surgery, with the diagnosis of change in bowel habits/stool caliber.  The various methods of treatment have been discussed with the patient and family. After consideration of risks, benefits and other options for treatment, the patient has consented to  Procedures with comments: COLONOSCOPY (N/A) - 7:30 am, ok rm 1 as a surgical intervention.  The patient's history has been reviewed, patient examined, no change in status, stable for surgery.  I have reviewed the patient's chart and labs.  Questions were answered to the patient's satisfaction.     [name] [name]  No change.  Diagnostic colonoscopy per plan.  The risks, benefits, limitations, alternatives and imponderables have been reviewed with the patient. Questions have been answered. All parties are agreeable.

## 2024-11-26 NOTE — Transfer of Care (Signed)
 Immediate Anesthesia Transfer of Care Note  Patient: [name] [name]  Procedure(s) Performed: COLONOSCOPY POLYPECTOMY, INTESTINE  Patient Location: Endoscopy Unit  Anesthesia Type:MAC  Level of Consciousness: awake, alert , oriented, and patient cooperative  Airway & Oxygen Therapy: Patient Spontanous Breathing  Post-op Assessment: Report given to RN, Post -op Vital signs reviewed and stable, and Patient moving all extremities X 4  Post vital signs: Reviewed and stable  Last Vitals:  Vitals Value Taken Time  BP 109/50 11/26/24 08:02  Temp 36.9 C 11/26/24 08:02  Pulse 74 11/26/24 08:02  Resp 13 11/26/24 08:02  SpO2 96 % 11/26/24 08:02    Last Pain:  Vitals:   11/26/24 0802  TempSrc: Oral  PainSc:       Patients Stated Pain Goal: 7 (11/26/24 9361)  Complications: No notable events documented.

## 2024-11-26 NOTE — Anesthesia Preprocedure Evaluation (Signed)
 Anesthesia Evaluation  Patient identified by MRN, date of birth, ID band Patient awake    Reviewed: Allergy & Precautions, H&P , NPO status , Patient's Chart, lab work & pertinent test results, reviewed documented beta blocker date and time   Airway Mallampati: II  TM Distance: >3 FB Neck ROM: full    Dental no notable dental hx.    Pulmonary neg pulmonary ROS   Pulmonary exam normal breath sounds clear to auscultation       Cardiovascular Exercise Tolerance: Good hypertension, + CAD   Rhythm:regular Rate:Normal     Neuro/Psych CVA  negative psych ROS   GI/Hepatic Neg liver ROS, PUD,GERD  Medicated,,  Endo/Other  negative endocrine ROS    Renal/GU negative Renal ROS  negative genitourinary   Musculoskeletal   Abdominal   Peds  Hematology negative hematology ROS (+)   Anesthesia Other Findings   Reproductive/Obstetrics negative OB ROS                              Anesthesia Physical Anesthesia Plan  ASA: 3  Anesthesia Plan: MAC   Post-op Pain Management:    Induction:   PONV Risk Score and Plan: Propofol infusion  Airway Management Planned:   Additional Equipment:   Intra-op Plan:   Post-operative Plan:   Informed Consent: I have reviewed the patients History and Physical, chart, labs and discussed the procedure including the risks, benefits and alternatives for the proposed anesthesia with the patient or authorized representative who has indicated his/her understanding and acceptance.     Dental Advisory Given  Plan Discussed with: CRNA  Anesthesia Plan Comments:         Anesthesia Quick Evaluation

## 2024-11-26 NOTE — Discharge Instructions (Signed)
°  Colonoscopy Discharge Instructions  Read the instructions outlined below and refer to this sheet in the next few weeks. These discharge instructions provide you with general information on caring for yourself after you leave the hospital. Your doctor may also give you specific instructions. While your treatment has been planned according to the most current medical practices available, unavoidable complications occasionally occur. If you have any problems or questions after discharge, call Dr. Shaaron at [phone]. ACTIVITY You may resume your regular activity, but move at a slower pace for the next 24 hours.  Take frequent rest periods for the next 24 hours.  Walking will help get rid of the air and reduce the bloated feeling in your belly (abdomen).  No driving for 24 hours (because of the medicine (anesthesia) used during the test).   Do not sign any important legal documents or operate any machinery for 24 hours (because of the anesthesia used during the test).  NUTRITION Drink plenty of fluids.  You may resume your normal diet as instructed by your doctor.  Begin with a light meal and progress to your normal diet. Heavy or fried foods are harder to digest and may make you feel sick to your stomach (nauseated).  Avoid alcoholic beverages for 24 hours or as instructed.  MEDICATIONS You may resume your normal medications unless your doctor tells you otherwise.  WHAT YOU CAN EXPECT TODAY Some feelings of bloating in the abdomen.  Passage of more gas than usual.  Spotting of blood in your stool or on the toilet paper.  IF YOU HAD POLYPS REMOVED DURING THE COLONOSCOPY: No aspirin products for 7 days or as instructed.  No alcohol for 7 days or as instructed.  Eat a soft diet for the next 24 hours.  FINDING OUT THE RESULTS OF YOUR TEST Not all test results are available during your visit. If your test results are not back during the visit, make an appointment with your caregiver to find out the  results. Do not assume everything is normal if you have not heard from your caregiver or the medical facility. It is important for you to follow up on all of your test results.  SEEK IMMEDIATE MEDICAL ATTENTION IF: You have more than a spotting of blood in your stool.  Your belly is swollen (abdominal distention).  You are nauseated or vomiting.  You have a temperature over 101.  You have abdominal pain or discomfort that is severe or gets worse throughout the day.    1 small polyp found and removed.  Likely has nothing to do with your recent symptoms.  Consider taking a fiber supplement daily such as Benefiber  Further recommendations to follow pending review of pathology report

## 2024-11-26 NOTE — Op Note (Signed)
 [facility] Patient Name: [name] [name] Procedure Date: 11/26/2024 7:03 AM MRN: [id number] Date of Birth: 01/17/1946 Attending MD: [name] , MD, [id number] CSN: [id number] Age: 78 Admit Type: Outpatient Procedure:                Colonoscopy Indications:              Change in stool caliber Providers:                [name], MD, Jon LABOR. Gerome RN, RN,                            Chad Wilson, Technician Referring MD:              Medicines:                Propofol per Anesthesia Complications:            No immediate complications. Estimated Blood Loss:     Estimated blood loss was minimal. Procedure:                Pre-Anesthesia Assessment:                           - Prior to the procedure, a History and Physical                            was performed, and patient medications and                            allergies were reviewed. The patient's tolerance of                            previous anesthesia was also reviewed. The risks                            and benefits of the procedure and the sedation                            options and risks were discussed with the patient.                            All questions were answered, and informed consent                            was obtained. Prior Anticoagulants: The patient has                            taken no anticoagulant or antiplatelet agents. ASA                            Grade Assessment: III - A patient with severe                            systemic disease. After reviewing the risks and  benefits, the patient was deemed in satisfactory                            condition to undergo the procedure.                           After obtaining informed consent, the colonoscope                            was passed under direct vision. Throughout the                            procedure, the patient's blood pressure, pulse, and                             oxygen saturations were monitored continuously. The                            [id number] ([id number]) Colon was introduced through                            the anus and advanced to the the cecum, identified                            by appendiceal orifice and ileocecal valve. The                            colonoscopy was performed without difficulty. The                            patient tolerated the procedure well. The quality                            of the bowel preparation was adequate. The                            ileocecal valve, appendiceal orifice, and rectum                            were photographed. Scope In: 7:42:56 AM Scope Out: 7:56:31 AM Scope Withdrawal Time: 0 hours 8 minutes 10 seconds  Total Procedure Duration: 0 hours 13 minutes 35 seconds  Findings:      The perianal and digital rectal examinations were normal.      A 4 mm polyp was found in the recto-sigmoid colon. The polyp was       sessile. The polyp was removed with a cold snare. Resection and       retrieval were complete. Estimated blood loss was minimal.      The exam was otherwise without abnormality on direct and retroflexion       views. Impression:               - One 4 mm polyp at the recto-sigmoid colon,  removed with a cold snare. Resected and retrieved.                           - The examination was otherwise normal on direct                            and retroflexion views. Moderate Sedation:      Moderate (conscious) sedation was personally administered by an       anesthesia professional. The following parameters were monitored: oxygen       saturation, heart rate, blood pressure, respiratory rate, EKG, adequacy       of pulmonary ventilation, and response to care. Recommendation:           - Patient has a contact number available for                            emergencies. The signs and symptoms of potential                            delayed complications  were discussed with the                            patient. Return to normal activities tomorrow.                            Written discharge instructions were provided to the                            patient.                           - Advance diet as tolerated.                           - Continue present medications. Consider a daily                            fiber supplement.                           - Repeat colonoscopy date to be determined after                            pending pathology results are reviewed for                            surveillance.                           - Return to GI office (date not yet determined). Procedure Code(s):        --- Professional ---                           [phone], Colonoscopy, flexible; with removal of  tumor(s), polyp(s), or other lesion(s) by snare                            technique Diagnosis Code(s):        --- Professional ---                           D12.7, Benign neoplasm of rectosigmoid junction                           R19.5, Other fecal abnormalities CPT copyright 2022 [employer]. All rights reserved. The codes documented in this report are preliminary and upon coder review may  be revised to meet current compliance requirements. [name], MD [name], MD 11/26/2024 8:05:42 AM This report has been signed electronically. Number of Addenda: 0

## 2024-11-27 ENCOUNTER — Encounter (HOSPITAL_COMMUNITY): Payer: Self-pay | Admitting: Internal Medicine

## 2024-11-27 LAB — SURGICAL PATHOLOGY

## 2024-12-01 ENCOUNTER — Other Ambulatory Visit: Payer: Self-pay | Admitting: Cardiology

## 2024-12-01 DIAGNOSIS — E785 Hyperlipidemia, unspecified: Secondary | ICD-10-CM

## 2024-12-02 ENCOUNTER — Ambulatory Visit: Payer: Self-pay | Admitting: Internal Medicine

## 2024-12-04 NOTE — Anesthesia Postprocedure Evaluation (Signed)
"   Anesthesia Post Note  Patient: [name] [name]  Procedure(s) Performed: COLONOSCOPY POLYPECTOMY, INTESTINE  Patient location during evaluation: Phase II Anesthesia Type: MAC Level of consciousness: awake Pain management: pain level controlled Vital Signs Assessment: post-procedure vital signs reviewed and stable Respiratory status: spontaneous breathing and respiratory function stable Cardiovascular status: blood pressure returned to baseline and stable Postop Assessment: no headache and no apparent nausea or vomiting Anesthetic complications: no Comments: Late entry   No notable events documented.   Last Vitals:  Vitals:   11/26/24 0805 11/26/24 0813  BP: 130/63 138/67  Pulse: 78 68  Resp: 17 14  Temp:    SpO2: 96% 97%    Last Pain:  Vitals:   11/26/24 0813  TempSrc:   PainSc: 0-No pain                 Yvonna PARAS Evelean Bigler      "
